# Patient Record
Sex: Male | Born: 1935 | Race: White | Hispanic: No | Marital: Single | Smoking: Former smoker
Health system: Southern US, Community
[De-identification: ages and names within clinical notes are randomized; demographics above are authoritative.]

## PROBLEM LIST (undated history)

## (undated) DIAGNOSIS — C3492 Malignant neoplasm of unspecified part of left bronchus or lung: Secondary | ICD-10-CM

## (undated) DIAGNOSIS — T4145XA Adverse effect of unspecified anesthetic, initial encounter: Secondary | ICD-10-CM

## (undated) DIAGNOSIS — R112 Nausea with vomiting, unspecified: Secondary | ICD-10-CM

## (undated) DIAGNOSIS — R51 Headache: Secondary | ICD-10-CM

## (undated) DIAGNOSIS — T8859XA Other complications of anesthesia, initial encounter: Secondary | ICD-10-CM

## (undated) DIAGNOSIS — F419 Anxiety disorder, unspecified: Secondary | ICD-10-CM

## (undated) DIAGNOSIS — K759 Inflammatory liver disease, unspecified: Secondary | ICD-10-CM

## (undated) DIAGNOSIS — R569 Unspecified convulsions: Secondary | ICD-10-CM

## (undated) DIAGNOSIS — N200 Calculus of kidney: Secondary | ICD-10-CM

## (undated) DIAGNOSIS — J189 Pneumonia, unspecified organism: Secondary | ICD-10-CM

## (undated) DIAGNOSIS — Z9889 Other specified postprocedural states: Secondary | ICD-10-CM

## (undated) DIAGNOSIS — M1990 Unspecified osteoarthritis, unspecified site: Secondary | ICD-10-CM

## (undated) DIAGNOSIS — K219 Gastro-esophageal reflux disease without esophagitis: Secondary | ICD-10-CM

## (undated) HISTORY — PX: HERNIA REPAIR: SHX51

## (undated) HISTORY — PX: CHOLECYSTECTOMY: SHX55

## (undated) HISTORY — PX: COLONOSCOPY: SHX174

---

## 2004-03-12 ENCOUNTER — Other Ambulatory Visit: Admission: RE | Admit: 2004-03-12 | Discharge: 2004-03-12 | Payer: Self-pay

## 2007-01-30 ENCOUNTER — Ambulatory Visit: Payer: Self-pay | Admitting: Cardiology

## 2007-03-17 ENCOUNTER — Inpatient Hospital Stay (HOSPITAL_COMMUNITY): Admission: RE | Admit: 2007-03-17 | Discharge: 2007-03-19 | Payer: Self-pay | Admitting: Neurosurgery

## 2007-03-17 ENCOUNTER — Ambulatory Visit: Payer: Self-pay | Admitting: Internal Medicine

## 2007-03-17 ENCOUNTER — Encounter: Payer: Self-pay | Admitting: Cardiovascular Disease

## 2007-03-17 ENCOUNTER — Ambulatory Visit: Payer: Self-pay | Admitting: Cardiovascular Disease

## 2008-07-24 IMAGING — CR DG CERVICAL SPINE 2 OR 3 VIEWS
1 series · 1 of 1 positions shown · non-contrast
Comparison: None.

CLINICAL DATA: ACDF. 
CERVICAL SPINE ? 2 VIEW - 03/17/07:

[view not recorded]
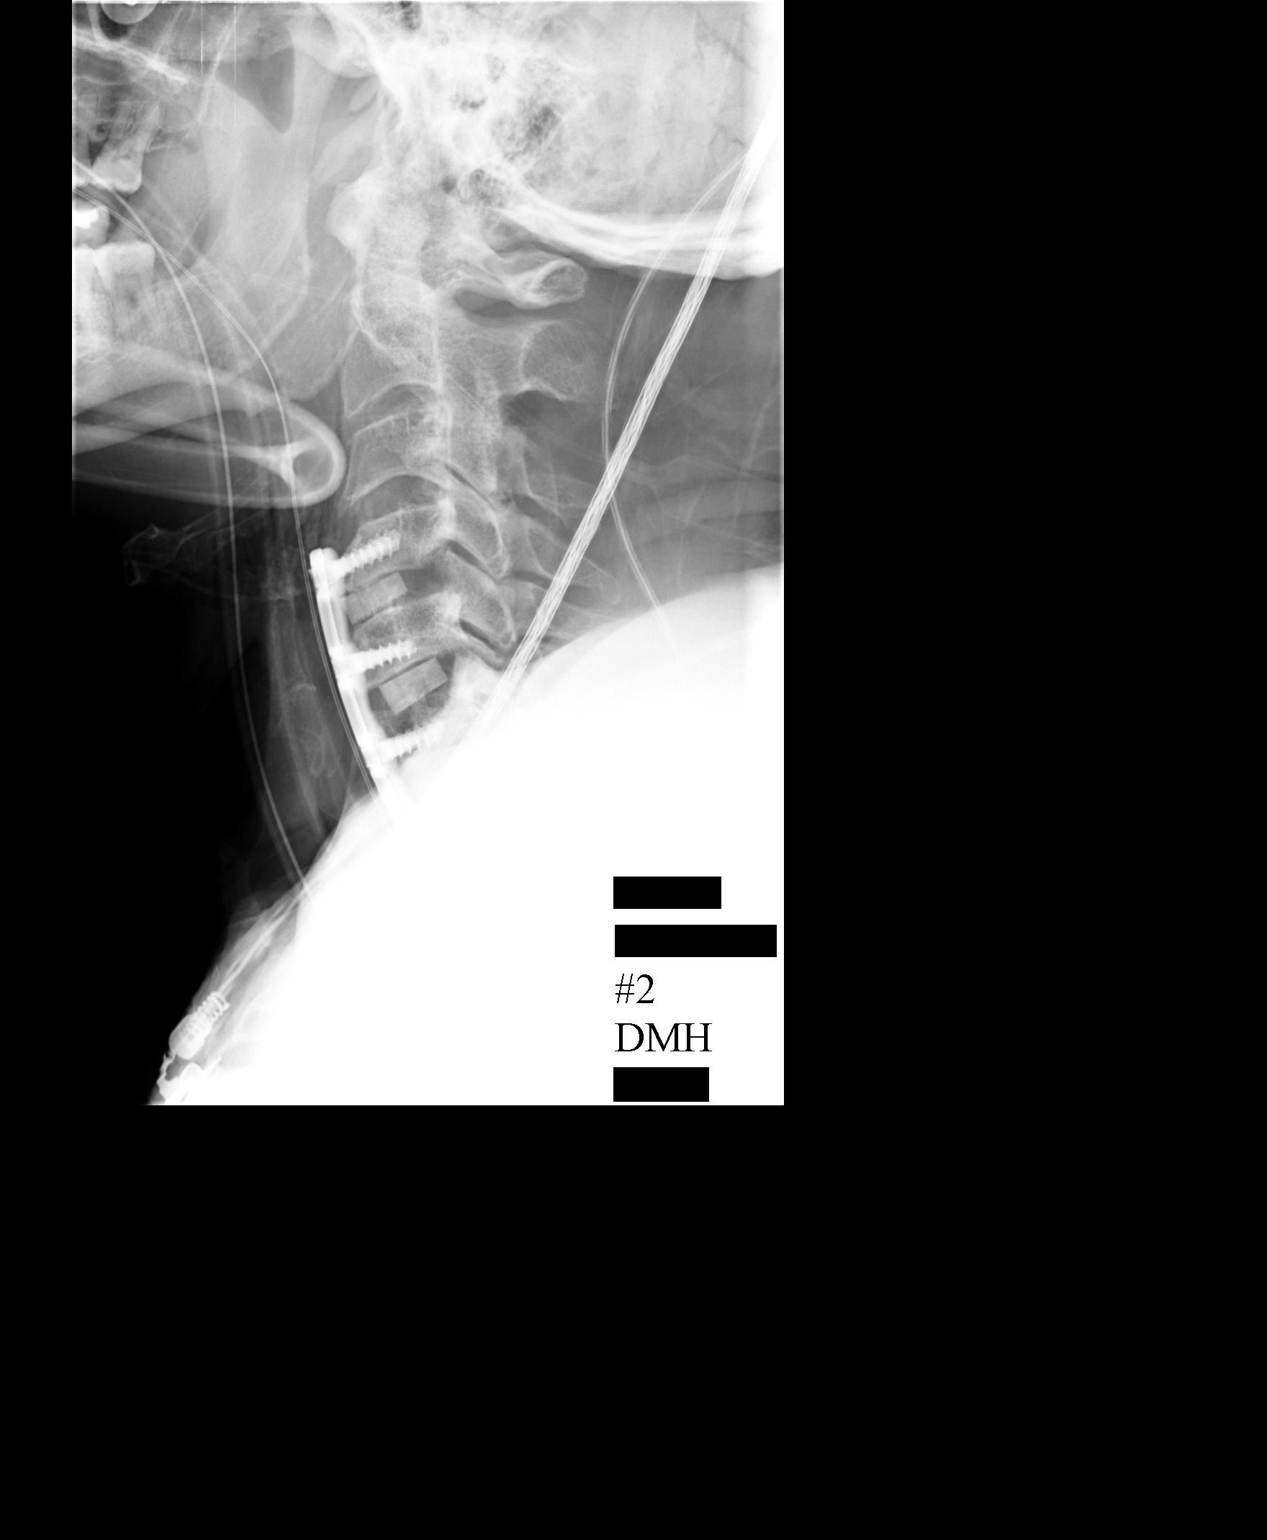

[1 of 1 positions shown; findings below may reference images not displayed]

FINDINGS: Two crosstable lateral views are submitted postoperatively from the operating room.  The initial view labeled #1 demonstrates an anterior localizing needle at C4-5.  
The 2nd view demonstrates interval anterior discectomy and fusion from C4 through C7 with anterior plate and screws and intervertebral bone plugs.  There is limited visualization inferior to the C6-7 disc space level.  No complications are evident.
IMPRESSION: Intraoperative views during C4-7 ACDF with limited inferior visualization.  No demonstrated complication.

## 2010-06-23 NOTE — Consult Note (Signed)
NAMEKELLER, Hunter Payne                 ACCOUNT NO.:  [id number]   MEDICAL RECORD NO.:  [id number]          PATIENT TYPE:  INP   LOCATION:  3009                           FACILITY:   PHYSICIAN:  Noralyn Pick. Eden Emms, MD, FACCDATE OF BIRTH:  February 07, 1935   DATE OF CONSULTATION:  03/17/2007  DATE OF DISCHARGE:                                 CONSULTATION   Hunter Payne is a 76 year old patient we are asked to see a relatively  acutely in the Neurology PACU.  He does not have a documented history of  coronary disease and has not been seen at [facility].  The patient  just underwent an anterior cervical diskectomy with fusion.  Postoperatively, he had chest pain.  Apparently intraoperatively, he had  some cardiac arrhythmias and Dr. Sondra Come felt that he had ST-segment  depression.   We reviewed EKGs for him.  In the perioperative period, he has a known  right bundle branch block which was at least described on stress testing  on January 30, 2007.  His EKGs primarily shows a right bundle branch  block with nonspecific ST-T wave changes.  He does have probably limb  lead reversal on his EKGs.  There is a question of an old MI since he  has minimal Q-waves in III and F.   His telemetry appears to show some periods of nonsustained ventricular  tachycardia.   The patient's pain is actually easing up with morphine   In the PACU, he had a bedside echocardiogram.  I have reviewed this and  he actually has hyperdynamic LV function with LVH, mild aortic root  dilatation with no regional wall motion abnormalities.  In talking to  the patient, however, he has had some exertional chest pain.  He has had  them over the last 3-5 years but states that over the last few weeks.  Because of this Dr. Cyndee Brightly had ordered a adenosine Myoview study.  We  received the report from January 30, 2007.  It was read by Dr. Myrtis Ser.  His Myoview study was normal with an EF in excess of 60%.   The patient currently has had stable  hemodynamics.  He has been treated  with analgesics, nitrates, and we will start esmolol drip.   He was also given a bolus of 2.5 mg of IV Lopressor.   The patient's past medical history is remarkable for cervical stenosis.  He has a history of a hernia repair in 1985, history of EtOH use without  hypertension, hypercholesterolemia or diabetes.   He is allergic to DEMEROL and NOVOCAIN.   His pain medicine here in the hospital includes Dilaudid, morphine, and  he got Ancef preoperatively.   FAMILY HISTORY:  Is remarkable for heart failure in the mother's side  but both mother and father died in their late 92s to 60s.  The patient  is married.  He lives at home.  His 60 year old son was a victim of a  local beating and has a brain injury.  He is a retired Product manager.   PHYSICAL EXAMINATION:  GENERAL:  The patient's  exam is remarkable for an  elderly white male in minor distress.  He has a cervical collar on with  a drain. VITAL SIGNS:  Currently stable.  The blood pressure 110/70,  heart rate is somewhat elevated between 90-100.  He is currently not  having any VT.  LUNGS:  Were clear with good diaphragmatic motion.  No wheezing.  HEART:  S1-S2 with normal heart sounds.  PMI normal.  ABDOMEN:  Benign bowel sounds are positive.  No abdominal aortic  aneurysm.  No hepatosplenomegaly.  Hepatic reflux.  EXTREMITIES:  Distal pulse are intact.  No edema.  NEURO:  Nonfocal.  SKIN:  Warm and dry.  No muscular weakness.   EKGs were as reviewed.  Initial point of care markers were negative.  His hematocrit is 39.5.  Initial CPK is 139 and negative.  Troponin is  0.01.  Potassium is slightly decreased at 3.6.   IMPRESSION:  1. Chest pain perioperatively.  Recent to Myoview which was low-risk.      No regional wall motion abnormalities by echo and no acute EKG      changes despite his nonsustained VT and question of ST-segment      changes on telemetry intraoperatively.  I do not  think he is having      an acute MI.  He will be treated with analgesics, nitro, and      esmolol which we will start at 500 mcg/kg bolus and 50 mcg/kg per      minute drip and titrate this to heart rate in the 70-80s.  The      patient is not a candidate for acute cath.  He still has a cervical      drain in and cannot take anticoagulants  2. Antecedent chest pain with nitro use.  The patient certainly needs      a workup.  If he rules out for myocardial infarction and remains      stable, suspect that catheterization will be in order.  He has had      a nonischemic Myoview recently but clearly has chest pain with      exertion and has taken nitro in the past and has now had some      nonsustained VT in the perioperative period.  3. Status post cervical spine surgery.  Routine postop care per      neurology.  We will be happy to follow the patient along here in      the hospital and make further recommendations before discharge.      Noralyn Pick. Eden Emms, MD, [facility]  Electronically Signed     PCN/MEDQ  D:  03/17/2007  T:  03/19/2007  Job:  [id number]

## 2010-06-23 NOTE — Op Note (Signed)
NAMEEARSEL, Hunter Payne                 ACCOUNT NO.:  [id number]   MEDICAL RECORD NO.:  [id number]          PATIENT TYPE:  INP   LOCATION:  2899                         FACILITY:  [facility]   PHYSICIAN:  Hilda Lias, M.D.   DATE OF BIRTH:  [birth year]   DATE OF PROCEDURE:  03/17/2007  DATE OF DISCHARGE:                               OPERATIVE REPORT   PREOPERATIVE DIAGNOSIS:  Cervical stenosis with cervical myelopathy C4,  C5-6, C6-7.   POSTOPERATIVE DIAGNOSIS:  Cervical stenosis with cervical myelopathy C4,  C5-6, C6-7.   PROCEDURE:  Anterior C4-5, C5-6, C6-7 diskectomy, decompression of the  spinal cord, foraminotomy, interbody fusion with allograft, plate,  microscope.   SURGEON:  Hilda Lias, M.D.   ASSISTANT:  Danae Orleans. Venetia Maxon, M.D.   CLINICAL HISTORY:  [name] is a gentleman seen by me in my office  because of difficulty walking.  He has had a problem with balance  associated with weakness.  This problem has been going on for several  years and in the past he was offered surgery but he declined.  X-rays  show severe spondylosis with stenosis and changes to the spinal cord  from C4 down to C7.  Surgery was advised.   DESCRIPTION OF PROCEDURE:  The patient was taken to the operating room  and after intubation, the left side of the neck was cleaned with  DuraPrep.  A longitudinal incision was made through the skin,  subcutaneous tissue and platysma down to cervical spine.  X-rays showed  that we were at the level of C4-5.  From then on a large osteophyte at  the level of C4-5 was removed.  We had to drill our way into the  anterior ligament, which was calcified.  Using the drill as well as the  1 and 2-mm Kerrison punch, we were able to remove quite a bit of  degenerative disk.  In the posterior ligament we found there was  calcification of the ligament with attachment to the dura mater.  Using  the 1-mm Kerrison punch, we were able to work our way above the dura  mater and  decompression of the spinal cord with bilateral foraminotomy  was done.  At the level of C5-6 and C6-7 we had the same finding but  slightly less pronounced but nevertheless quite compromising it all.  Diskectomy at C5-6 and C6-7 as well as foraminotomy was achieved.  Then  the endplates were drilled.  Three allografts, two of 7 mm, one of 6,  with autograft inside were inserted.  The one that was 6 was at the  level of C4-5 and the other one at the level of C6-7.  This was followed  by a plate using eight screws.  The lateral cervical spine showed good  position of the bone graft.  Then, although we achieved good hemostasis,  a drain was left in the precervical area.  From then on the wound was  closed with Vicryl and a Steri-Strip.           ______________________________  Hilda Lias, M.D.     EB/MEDQ  D:  03/17/2007  T:  03/18/2007  Job:  [id number]

## 2010-10-30 LAB — CBC
HCT: 39.5
HCT: 49.2
Hemoglobin: 13.7
Hemoglobin: 16.5
MCHC: 33.5
MCHC: 34.6
MCV: 91.5
Platelets: 205
RBC: 4.39
RDW: 12.6
RDW: 13.1

## 2010-10-30 LAB — COMPREHENSIVE METABOLIC PANEL
ALT: 19
ALT: 19
AST: 21
Albumin: 4.2
Alkaline Phosphatase: 51
BUN: 15
CO2: 24
CO2: 30
Chloride: 103
GFR calc non Af Amer: >60
GFR calc non Af Amer: >60
Glucose, Bld: 139 — ABNORMAL HIGH
Potassium: 3.6
Potassium: 4.4
Sodium: 140
Total Bilirubin: 1.1
Total Protein: 6

## 2010-10-30 LAB — CARDIAC PANEL(CRET KIN+CKTOT+MB+TROPI)
CK, MB: 2.8
Relative Index: 1.1
Relative Index: 1.3
Relative Index: 1.6
Troponin I: <0.01

## 2010-10-30 LAB — MAGNESIUM: Magnesium: 1.8

## 2013-02-21 NOTE — H&P (Signed)
HISTORY AND PHYSICAL  Hunter Payne is a 79 y.o. male patient with OV:FIEPPIRCC:Painful teeth  No diagnosis found.  No past medical history on file.  No current facility-administered medications for this encounter.   No current outpatient prescriptions on file.   Allergies not on file Active Problems:   * No active hospital problems. *  Vitals: There were no vitals taken for this visit. Lab results:No results found for this or any previous visit (from the past 24 hour(s)). Radiology Results: No results found. General appearance: alert, cooperative and no distress Head: Normocephalic, without obvious abnormality, atraumatic Eyes: negative Nose: Nares normal. Septum midline. Mucosa normal. No drainage or sinus tenderness. Throat: Dental caries teeth  #3, 4, 5, 9, 18, 23, 24, 25, 26, 28, 31, bilateral mandibular tori Neck: no adenopathy, supple, symmetrical, trachea midline and thyroid not enlarged, symmetric, no tenderness/mass/nodules Resp: clear to auscultation bilaterally Cardio: regular rate and rhythm, S1, S2 normal, no murmur, click, rub or gallop  Assessment:Non-restorable teeth # 3, 4, 5, 9, 18, 23, 24, 25, 26, 28, 31, bilateral mandibular tori  Plan:Extrction teeth #3, 4, 5, 9, 18, 23, 24, 25, 26, 28, 31, removal  bilateral mandibular tori, alveoloplasty. General anesthesia. Day surgery.   Georgia LopesJENSEN,Ellana Kawa M 02/21/2013

## 2013-02-23 ENCOUNTER — Encounter (HOSPITAL_COMMUNITY)
Admission: RE | Admit: 2013-02-23 | Discharge: 2013-02-23 | Disposition: A | Discharge status: Home / Self Care | Payer: Medicare Other | Source: Ambulatory Visit | Attending: Oral Surgery | Admitting: Oral Surgery

## 2013-02-23 ENCOUNTER — Encounter (HOSPITAL_COMMUNITY): Payer: Self-pay

## 2013-02-23 DIAGNOSIS — Z01812 Encounter for preprocedural laboratory examination: Secondary | ICD-10-CM | POA: Diagnosis not present

## 2013-02-23 DIAGNOSIS — M278 Other specified diseases of jaws: Secondary | ICD-10-CM | POA: Diagnosis not present

## 2013-02-23 DIAGNOSIS — K083 Retained dental root: Secondary | ICD-10-CM | POA: Diagnosis not present

## 2013-02-23 DIAGNOSIS — M898X9 Other specified disorders of bone, unspecified site: Secondary | ICD-10-CM | POA: Diagnosis present

## 2013-02-23 DIAGNOSIS — K219 Gastro-esophageal reflux disease without esophagitis: Secondary | ICD-10-CM | POA: Diagnosis not present

## 2013-02-23 DIAGNOSIS — K029 Dental caries, unspecified: Secondary | ICD-10-CM | POA: Diagnosis not present

## 2013-02-23 DIAGNOSIS — M129 Arthropathy, unspecified: Secondary | ICD-10-CM | POA: Diagnosis not present

## 2013-02-23 DIAGNOSIS — Z87891 Personal history of nicotine dependence: Secondary | ICD-10-CM | POA: Diagnosis not present

## 2013-02-23 HISTORY — DX: Nausea with vomiting, unspecified: R11.2

## 2013-02-23 HISTORY — DX: Gastro-esophageal reflux disease without esophagitis: K21.9

## 2013-02-23 HISTORY — DX: Calculus of kidney: N20.0

## 2013-02-23 HISTORY — DX: Anxiety disorder, unspecified: F41.9

## 2013-02-23 HISTORY — DX: Inflammatory liver disease, unspecified: K75.9

## 2013-02-23 HISTORY — DX: Pneumonia, unspecified organism: J18.9

## 2013-02-23 HISTORY — DX: Adverse effect of unspecified anesthetic, initial encounter: T41.45XA

## 2013-02-23 HISTORY — DX: Other complications of anesthesia, initial encounter: T88.59XA

## 2013-02-23 HISTORY — DX: Other specified postprocedural states: Z98.890

## 2013-02-23 HISTORY — DX: Unspecified convulsions: R56.9

## 2013-02-23 HISTORY — DX: Headache: R51

## 2013-02-23 HISTORY — DX: Unspecified osteoarthritis, unspecified site: M19.90

## 2013-02-23 LAB — COMPREHENSIVE METABOLIC PANEL
ALT: 9 U/L (ref 0–53)
AST: 12 U/L (ref 0–37)
Albumin: 3.6 g/dL (ref 3.5–5.2)
Alkaline Phosphatase: 69 U/L (ref 39–117)
BUN: 11 mg/dL (ref 6–23)
CALCIUM: 9.1 mg/dL (ref 8.4–10.5)
CO2: 24 mEq/L (ref 19–32)
Chloride: 101 mEq/L (ref 96–112)
Creatinine, Ser: 0.73 mg/dL (ref 0.50–1.35)
GFR calc non Af Amer: 88 mL/min — ABNORMAL LOW (ref >90)
GLUCOSE: 97 mg/dL (ref 70–99)
Potassium: 4.3 mEq/L (ref 3.7–5.3)
Sodium: 139 mEq/L (ref 137–147)
TOTAL PROTEIN: 7.2 g/dL (ref 6.0–8.3)
Total Bilirubin: 0.3 mg/dL (ref 0.3–1.2)

## 2013-02-23 LAB — CBC
HEMATOCRIT: 43.5 % (ref 39.0–52.0)
HEMOGLOBIN: 15.4 g/dL (ref 13.0–17.0)
MCH: 31.2 pg (ref 26.0–34.0)
MCHC: 35.4 g/dL (ref 30.0–36.0)
MCV: 88.1 fL (ref 78.0–100.0)
Platelets: 192 10*3/uL (ref 150–400)
RBC: 4.94 MIL/uL (ref 4.22–5.81)
RDW: 12.7 % (ref 11.5–15.5)
WBC: 7.6 10*3/uL (ref 4.0–10.5)

## 2013-02-23 NOTE — Pre-Procedure Instructions (Signed)
Landry DykeBilly R Borchard  02/23/2013   Your procedure is scheduled on:  Monday, February 26, 2013 @ 10:30 AM  [facility]use Main Entrance "A'') at 8:30 AM.  Call this number if you have problems the morning of surgery: [phone]   Remember:   Do not eat food or drink liquids after midnight.   Take these medicines the morning of surgery with A SIP OF WATER: omeprazole (PRILOSEC) 20 MG capsule if needed: oxyCODONE-acetaminophen (PERCOCET) 10-325 MG per tablet Stop taking Aspirin, vitamins and herbal medications. Do not take any NSAIDs ie: Ibuprofen, Advil, Naproxen or any medication containing Aspirin.  Do not wear jewelry, make-up or nail polish.  Do not wear lotions, powders, or perfumes. You may wear deodorant.  Do not shave 48 hours prior to surgery. Men may shave face and neck.  Do not bring valuables to the hospital.  [facility] not [facility] for any belongings or valuables.               Contacts, dentures or bridgework may not be worn into surgery.  Leave suitcase in the car. After surgery it may be brought to your room.  For patients admitted to the hospital, discharge time is determined by your  treatment team.               Patients discharged the day of surgery will not be allowed to drive home.  Name and phone number of your driver:   Special Instructions: Shower using CHG 2 nights before surgery and the night before surgery.  If you shower the day of surgery use CHG.  Use special wash - you have one bottle of CHG for all showers.  You should use approximately 1/3 of the bottle for each shower.   Please read over the following fact sheets that you were given: Pain Booklet, Coughing and Deep Breathing and Surgical Site Infection Prevention

## 2013-02-25 MED ORDER — CEFAZOLIN SODIUM-DEXTROSE 2-3 GM-% IV SOLR
2.0000 g | INTRAVENOUS | Status: AC
  Administered 2013-02-26: 2 g via INTRAVENOUS

## 2013-02-26 ENCOUNTER — Encounter (HOSPITAL_COMMUNITY)
Admission: RE | Disposition: A | Discharge status: Home / Self Care | Payer: Self-pay | Source: Ambulatory Visit | Attending: Oral Surgery

## 2013-02-26 ENCOUNTER — Ambulatory Visit (HOSPITAL_COMMUNITY): Payer: Medicare Other | Admitting: Anesthesiology

## 2013-02-26 ENCOUNTER — Encounter (HOSPITAL_COMMUNITY): Payer: Self-pay | Admitting: *Deleted

## 2013-02-26 ENCOUNTER — Encounter (HOSPITAL_COMMUNITY): Payer: Medicare Other | Admitting: Anesthesiology

## 2013-02-26 ENCOUNTER — Ambulatory Visit (HOSPITAL_COMMUNITY)
Admission: RE | Admit: 2013-02-26 | Discharge: 2013-02-26 | Disposition: A | Discharge status: Home / Self Care | Payer: Medicare Other | Source: Ambulatory Visit | Attending: Oral Surgery | Admitting: Oral Surgery

## 2013-02-26 DIAGNOSIS — M270 Developmental disorders of jaws: Secondary | ICD-10-CM

## 2013-02-26 DIAGNOSIS — K0530 Chronic periodontitis, unspecified: Secondary | ICD-10-CM

## 2013-02-26 DIAGNOSIS — K029 Dental caries, unspecified: Secondary | ICD-10-CM | POA: Insufficient documentation

## 2013-02-26 DIAGNOSIS — K219 Gastro-esophageal reflux disease without esophagitis: Secondary | ICD-10-CM | POA: Insufficient documentation

## 2013-02-26 DIAGNOSIS — M278 Other specified diseases of jaws: Secondary | ICD-10-CM | POA: Diagnosis not present

## 2013-02-26 DIAGNOSIS — Z01812 Encounter for preprocedural laboratory examination: Secondary | ICD-10-CM | POA: Insufficient documentation

## 2013-02-26 DIAGNOSIS — Z87891 Personal history of nicotine dependence: Secondary | ICD-10-CM | POA: Insufficient documentation

## 2013-02-26 DIAGNOSIS — M129 Arthropathy, unspecified: Secondary | ICD-10-CM | POA: Insufficient documentation

## 2013-02-26 DIAGNOSIS — K083 Retained dental root: Secondary | ICD-10-CM | POA: Insufficient documentation

## 2013-02-26 HISTORY — PX: MULTIPLE EXTRACTIONS WITH ALVEOLOPLASTY: SHX5342

## 2013-02-26 SURGERY — MULTIPLE EXTRACTION WITH ALVEOLOPLASTY
Anesthesia: General | Site: Mouth

## 2013-02-26 MED ORDER — BUPIVACAINE-EPINEPHRINE 0.25% -1:200000 IJ SOLN
INTRAMUSCULAR | Status: DC | PRN
  Administered 2013-02-26: 15 mL

## 2013-02-26 MED ORDER — LACTATED RINGERS IV SOLN
Freq: Once | INTRAVENOUS | Status: AC
  Administered 2013-02-26: 08:00:00 via INTRAVENOUS

## 2013-02-26 MED ORDER — FENTANYL CITRATE 0.05 MG/ML IJ SOLN
INTRAMUSCULAR | Status: DC | PRN
  Administered 2013-02-26: 75 ug via INTRAVENOUS

## 2013-02-26 MED ORDER — LACTATED RINGERS IV SOLN
INTRAVENOUS | Status: DC | PRN
  Administered 2013-02-26: 08:00:00 via INTRAVENOUS

## 2013-02-26 MED ORDER — HYDROMORPHONE HCL PF 1 MG/ML IJ SOLN
0.2500 mg | INTRAMUSCULAR | Status: DC | PRN
Start: 2013-02-26 — End: 2013-02-26

## 2013-02-26 MED ORDER — OXYCODONE-ACETAMINOPHEN 10-325 MG PO TABS: 1.0000 | ORAL_TABLET | ORAL | Status: AC | PRN

## 2013-02-26 MED ORDER — PROPOFOL 10 MG/ML IV BOLUS
INTRAVENOUS | Status: DC | PRN
  Administered 2013-02-26: 200 mg via INTRAVENOUS

## 2013-02-26 MED ORDER — OXYCODONE HCL 5 MG/5ML PO SOLN
5.0000 mg | Freq: Once | ORAL | Status: AC | PRN
  Administered 2013-02-26: 5 mg via ORAL

## 2013-02-26 MED ORDER — EPHEDRINE SULFATE 50 MG/ML IJ SOLN
INTRAMUSCULAR | Status: DC | PRN
  Administered 2013-02-26: 10 mg via INTRAVENOUS
  Administered 2013-02-26 (×2): 5 mg via INTRAVENOUS

## 2013-02-26 MED ORDER — ACETAMINOPHEN 160 MG/5ML PO SUSP
ORAL | Status: AC
  Filled 2013-02-26: qty 10

## 2013-02-26 MED ORDER — OXYCODONE HCL 5 MG/5ML PO SOLN
ORAL | Status: AC
  Filled 2013-02-26: qty 15

## 2013-02-26 MED ORDER — ONDANSETRON HCL 4 MG/2ML IJ SOLN
INTRAMUSCULAR | Status: DC | PRN
  Administered 2013-02-26: 4 mg via INTRAVENOUS

## 2013-02-26 MED ORDER — SODIUM CHLORIDE 0.9 % IR SOLN
Status: DC | PRN
  Administered 2013-02-26: 1000 mL

## 2013-02-26 MED ORDER — PHENYLEPHRINE HCL 10 MG/ML IJ SOLN
INTRAMUSCULAR | Status: DC | PRN
  Administered 2013-02-26 (×4): 80 ug via INTRAVENOUS

## 2013-02-26 MED ORDER — SUCCINYLCHOLINE CHLORIDE 20 MG/ML IJ SOLN
INTRAMUSCULAR | Status: DC | PRN
  Administered 2013-02-26: 100 mg via INTRAVENOUS

## 2013-02-26 MED ORDER — BUPIVACAINE-EPINEPHRINE PF 0.25-1:200000 % IJ SOLN
INTRAMUSCULAR | Status: AC
  Filled 2013-02-26: qty 30

## 2013-02-26 MED ORDER — OXYCODONE HCL 5 MG PO TABS: 5.0000 mg | ORAL_TABLET | Freq: Once | ORAL | Status: AC | PRN

## 2013-02-26 MED ORDER — ACETAMINOPHEN 160 MG/5ML PO SOLN
320.0000 mg | Freq: Once | ORAL | Status: AC
  Administered 2013-02-26: 320 mg via ORAL

## 2013-02-26 MED ORDER — 0.9 % SODIUM CHLORIDE (POUR BTL) OPTIME
TOPICAL | Status: DC | PRN
  Administered 2013-02-26: 1000 mL

## 2013-02-26 MED ORDER — DEXAMETHASONE SODIUM PHOSPHATE 4 MG/ML IJ SOLN
INTRAMUSCULAR | Status: DC | PRN
  Administered 2013-02-26: 8 mg via INTRAVENOUS

## 2013-02-26 MED ORDER — ONDANSETRON HCL 4 MG/2ML IJ SOLN: 4.0000 mg | Freq: Four times a day (QID) | INTRAMUSCULAR | Status: DC | PRN

## 2013-02-26 MED ORDER — OXYCODONE HCL 5 MG PO TABS
10.0000 mg | ORAL_TABLET | Freq: Once | ORAL | Status: AC
  Administered 2013-02-26: 10 mg via ORAL

## 2013-02-26 MED ORDER — OXYMETAZOLINE HCL 0.05 % NA SOLN
NASAL | Status: DC | PRN
  Administered 2013-02-26: 1 via NASAL

## 2013-02-26 SURGICAL SUPPLY — 28 items
BUR CROSS CUT FISSURE 1.6 (BURR) ×2 IMPLANT
BUR CROSS CUT FISSURE 1.6MM (BURR) ×1
BUR EGG ELITE 4.0 (BURR) ×1 IMPLANT
BUR EGG ELITE 4.0MM (BURR) ×1
CANISTER SUCTION 2500CC (MISCELLANEOUS) ×3 IMPLANT
COVER SURGICAL LIGHT HANDLE (MISCELLANEOUS) ×3 IMPLANT
CRADLE DONUT ADULT HEAD (MISCELLANEOUS) ×3 IMPLANT
DECANTER SPIKE VIAL GLASS SM (MISCELLANEOUS) ×3 IMPLANT
GAUZE PACKING FOLDED 2  STR (GAUZE/BANDAGES/DRESSINGS) ×2
GAUZE PACKING FOLDED 2 STR (GAUZE/BANDAGES/DRESSINGS) ×1 IMPLANT
GLOVE BIO SURGEON STRL SZ 6.5 (GLOVE) ×4 IMPLANT
GLOVE BIO SURGEON STRL SZ7.5 (GLOVE) ×3 IMPLANT
GLOVE BIO SURGEONS STRL SZ 6.5 (GLOVE) ×2
GLOVE BIOGEL PI IND STRL 7.0 (GLOVE) ×2 IMPLANT
GLOVE BIOGEL PI INDICATOR 7.0 (GLOVE) ×4
GOWN STRL NON-REIN LRG LVL3 (GOWN DISPOSABLE) ×6 IMPLANT
GOWN STRL REIN XL XLG (GOWN DISPOSABLE) ×3 IMPLANT
KIT BASIN OR (CUSTOM PROCEDURE TRAY) ×3 IMPLANT
KIT ROOM TURNOVER OR (KITS) ×3 IMPLANT
NEEDLE 22X1 1/2 (OR ONLY) (NEEDLE) ×3 IMPLANT
NS IRRIG 1000ML POUR BTL (IV SOLUTION) ×3 IMPLANT
PAD ARMBOARD 7.5X6 YLW CONV (MISCELLANEOUS) ×6 IMPLANT
SUT CHROMIC 3 0 PS 2 (SUTURE) ×5 IMPLANT
SYR CONTROL 10ML LL (SYRINGE) ×3 IMPLANT
TOWEL OR 17X26 10 PK STRL BLUE (TOWEL DISPOSABLE) ×3 IMPLANT
TRAY ENT MC OR (CUSTOM PROCEDURE TRAY) ×3 IMPLANT
TUBING IRRIGATION (MISCELLANEOUS) ×2 IMPLANT
YANKAUER SUCT BULB TIP NO VENT (SUCTIONS) ×3 IMPLANT

## 2013-02-26 NOTE — H&P (Signed)
H&P documentation  -History and Physical Reviewed  -Patient has been re-examined  -No change in the plan of care  Hunter Payne M  

## 2013-02-26 NOTE — Anesthesia Preprocedure Evaluation (Signed)
Anesthesia Evaluation  Patient identified by MRN, date of birth, ID band Patient awake    Reviewed: Allergy & Precautions, H&P , NPO status , Patient's Chart, lab work & pertinent test results  History of Anesthesia Complications (+) PONV  Airway Mallampati: II  Neck ROM: full    Dental   Pulmonary former smoker,          Cardiovascular     Neuro/Psych  Headaches, Seizures -,  Anxiety    GI/Hepatic GERD-  ,(+) Hepatitis -  Endo/Other    Renal/GU      Musculoskeletal  (+) Arthritis -,   Abdominal   Peds  Hematology   Anesthesia Other Findings   Reproductive/Obstetrics                           Anesthesia Physical Anesthesia Plan  ASA: II  Anesthesia Plan: General   Post-op Pain Management:    Induction: Intravenous  Airway Management Planned: Nasal ETT  Additional Equipment:   Intra-op Plan:   Post-operative Plan: Extubation in OR  Informed Consent: I have reviewed the patients History and Physical, chart, labs and discussed the procedure including the risks, benefits and alternatives for the proposed anesthesia with the patient or authorized representative who has indicated his/her understanding and acceptance.     Plan Discussed with: CRNA, Anesthesiologist and Surgeon  Anesthesia Plan Comments:         Anesthesia Quick Evaluation

## 2013-02-26 NOTE — Anesthesia Procedure Notes (Signed)
Procedure Name: Intubation Date/Time: 02/26/2013 10:22 AM Performed by: Sharlene DoryWALKER, Jairo Bellew E Pre-anesthesia Checklist: Patient identified, Emergency Drugs available, Suction available, Patient being monitored and Timeout performed Patient Re-evaluated:Patient Re-evaluated prior to inductionOxygen Delivery Method: Circle system utilized Preoxygenation: Pre-oxygenation with 100% oxygen Intubation Type: IV induction Laryngoscope Size: Mac and 4 Grade View: Grade II Nasal Tubes: Left, Nasal prep performed, Nasal Rae and Magill forceps- large, utilized Tube size: 7.0 mm Number of attempts: 1 Placement Confirmation: ETT inserted through vocal cords under direct vision,  positive ETCO2 and breath sounds checked- equal and bilateral Tube secured with: Tape Dental Injury: Teeth and Oropharynx as per pre-operative assessment and Bloody posterior oropharynx

## 2013-02-26 NOTE — Discharge Instructions (Signed)
What to Eat after Tooth extraction:   ° ° °For your first meals, you should eat lightly; only small meals at first.   Avoid Sharp, Crunchy, and Hot foods.   If you do not have nausea, you may eat larger meals.  Avoid spicy, greasy and heavy food, as these may make you sick after the anesthesia.  ° ° °General Anesthesia, Adult, Care After  °Refer to this sheet in the next few weeks. These instructions provide you with information on caring for yourself after your procedure. Your health care provider may also give you more specific instructions. Your treatment has been planned according to current medical practices, but problems sometimes occur. Call your health care provider if you have any problems or questions after your procedure.  °WHAT TO EXPECT AFTER THE PROCEDURE  °After the procedure, it is typical to experience:  °Sleepiness.  °Nausea and vomiting. °HOME CARE INSTRUCTIONS  °For the first 24 hours after general anesthesia:  °Have a responsible person with you.  °Do not drive a car. If you are alone, do not take public transportation.  °Do not drink alcohol.  °Do not take medicine that has not been prescribed by your health care provider.  °Do not sign important papers or make important decisions.  °You may resume a normal diet and activities as directed by your health care provider.  °Change bandages (dressings) as directed.  °If you have questions or problems that seem related to general anesthesia, call the hospital and ask for the anesthetist or anesthesiologist on call. °SEEK MEDICAL CARE IF:  °You have nausea and vomiting that continue the day after anesthesia.  °You develop a rash. °SEEK IMMEDIATE MEDICAL CARE IF:  °You have difficulty breathing.  °You have chest pain.  °You have any allergic problems. °Document Released: 05/03/2000 Document Revised: 09/27/2012 Document Reviewed: 08/10/2012  °ExitCare® Patient Information ©2014 [employer].  ° ° °

## 2013-02-26 NOTE — Transfer of Care (Signed)
Immediate Anesthesia Transfer of Care Note  Patient: Hunter Payne  Procedure(s) Performed: Procedure(s): MULTIPLE EXTRACION WITH ALVEOLOPLASTY/REMOVAL TORI (N/A)  Patient Location: PACU  Anesthesia Type:General  Level of Consciousness: awake, alert  and oriented  Airway & Oxygen Therapy: Patient Spontanous Breathing and Patient connected to face mask oxygen  Post-op Assessment: Report given to PACU RN, Post -op Vital signs reviewed and stable and Patient moving all extremities X 4  Post vital signs: Reviewed and stable  Complications: No apparent anesthesia complications

## 2013-02-26 NOTE — Anesthesia Postprocedure Evaluation (Signed)
Anesthesia Post Note  Patient: Hunter Payne  Procedure(s) Performed: Procedure(s) (LRB): MULTIPLE EXTRACION WITH ALVEOLOPLASTY/REMOVAL TORI (N/A)  Anesthesia type: General  Patient location: PACU  Post pain: Pain level controlled and Adequate analgesia  Post assessment: Post-op Vital signs reviewed, Patient's Cardiovascular Status Stable, Respiratory Function Stable, Patent Airway and Pain level controlled  Last Vitals:  Filed Vitals:   02/26/13 1200  BP: 148/85  Pulse: 87  Temp: 36.3 C  Resp:     Post vital signs: Reviewed and stable  Level of consciousness: awake, alert  and oriented  Complications: No apparent anesthesia complications

## 2013-02-26 NOTE — Op Note (Signed)
02/26/2013  11:09 AM  PATIENT:  Hunter Payne  79 y.o. male  PRE-OPERATIVE DIAGNOSIS:  NONRESTORABLE TEETH # 3, 4, 5, 6, 9, 18, 23, 24, 25, 26, 28, 30, BILATERAL MANDIBULAR LINGUAL TORI  POST-OPERATIVE DIAGNOSIS:  SAME + Retained root # 21, Left maxillary buccal exostosis  PROCEDURE:  Procedure(s): MULTIPLE EXTRACTION 3, 4, 5, 6, 18, 21, 23, 24, 25, 26, 28, 30 WITH ALVEOLOPLASTY,  REMOVAL TORI,  removal left maxillary buccal exostosis, Closure Right oral antral communication with connective tissue graft  SURGEON:  Surgeon(s): Georgia LopesScott M Kiasha Bellin, DDS  ANESTHESIA:   local and general  EBL:  minimal  DRAINS: none   SPECIMEN:  No Specimen  COUNTS:  YES  PLAN OF CARE: Discharge to home after PACU  PATIENT DISPOSITION:  PACU - hemodynamically stable.   PROCEDURE DETAILS: Dictation #[id number]  Georgia LopesScott M. Jalaina Salyers, DMD 02/26/2013 11:09 AM

## 2013-02-26 NOTE — Preoperative (Signed)
Beta Blockers   Reason not to administer Beta Blockers:Not Applicable 

## 2013-02-26 NOTE — Op Note (Signed)
NAMJosetta Huddle:  Baiz, Jaylun                 ACCOUNT NO.:  [id number]  MEDICAL RECORD NO.:  [id number]  LOCATION:  [facility]                         FACILITY:  [facility]  PHYSICIAN:  Georgia LopesScott M. Aveyah Greenwood, M.D.  DATE OF BIRTH:  1934/04/20  DATE OF PROCEDURE:  02/26/2013 DATE OF DISCHARGE:  02/26/2013                              OPERATIVE REPORT   PREOPERATIVE DIAGNOSES:  Nonrestorable teeth #3, 4, 5, 6, 9, 18, 23, 24, 25, 26, 28, 30, bilateral mandibular lingual tori.  POSTOPERATIVE DIAGNOSES:  Nonrestorable teeth #3, 4, 5, 6, 9, 18, 23, 24, 25, 26, 28, 30, bilateral mandibular lingual tori, retained root #21, previously extracted tooth #9 and left maxillary buccal exostosis.  PROCEDURE:  Multiple extraction of teeth #3, 4, 5, 6, 18, 21, 23, 24, 25, 26, 28, 30, alveoplasty of right and left maxilla and mandible, removal of bilateral mandibular lingual tori and removal of left maxillary buccal exostosis.  SURGEON:  Georgia LopesScott M. Malakye Nolden, MD  ANESTHESIA:  General nasal intubation.  PROCEDURE:  The patient was taken to the operating room, placed on the table in supine position.  General anesthesia was administered intravenously and a nasal endotracheal tube was placed and secured.  The eyes were protected and the patient was draped for the procedure.  A time-out was performed.  The posterior pharynx was suctioned and a throat pack was placed.  A 2% lidocaine with 1:100,000 epinephrine was infiltrated in an inferior alveolar block on the right and left sides and a buccal and palatal infiltration in the maxilla total of 15 mL was utilized, and a sweetheart retractor and bite block were placed inside the mouth and then a 15 blade was used to make an incision in the mandible around tooth #18 carrying lingually along the gingival sulcus until tooth #24 was reached.  The periosteum was reflected.  Tooth #18 was removed using the 301 elevator and lower universal forceps.  Then, the periosteum was reflected in the  area of #21.  There was found to be a root imbedded in the bone.  The handpiece was used to remove bone around this and then the root was elevated with a 301 elevator and then the anterior teeth #23, 24, 25, and 26 were elevated and with a 301 elevator and removed from the mouth with Asch forceps.  Then, the lingual periosteum was reflected away from the bony torus and a Seldin retractor was used to retract the lingual tissues for protection.  Then, the egg-shaped bur and bone file were used to perform the torus removal. Then, the area was irrigated and closed with 3-0 chromic.  A 15 blade was then used to make an incision beginning in the area of tooth #14, carrying anteriorly to tooth #9.  The periosteum was reflected.  No tooth #9 was identified or any root fragments.  There was, however, a left buccal exostosis in the area of tooth #12.  This was smoothed with the egg-shaped bur and bone file.  Then, this area was irrigated and closed with 3-0 chromic.  Then, the bite block and sweetheart retractor were repositioned to the other side of the mouth and a 15 blade was used to make  a lingual incision beginning at tooth #27 carrying approximately tooth #31.  The periosteum was reflected and attempts were made to remove teeth #29 and 30 with the forceps.  However, the teeth fractured and required bone removal and sectioning prior to removal.  Then, the teeth were elevated with a 301 elevator and removed from the mouth. Then, the lingual tissue was further retracted and using the egg-shaped bur and bone file, the alveoplasty was performed within the mandible and the right side was closed after irrigating with 3-0 chromic.  Then in the maxilla, a 15 blade was used to make an incision around teeth #3, 4, 5, 6 on the buccal and palatal aspects.  The periosteum was reflected. The teeth were elevated with 301 elevator.  Tooth #3 required sectioning and removal of individual roots.  Teeth #4, 5  were removed with the universal forceps.  Tooth #6 required additional bone removal as well using the Stryker handpiece.  After the right maxillary teeth were removed, there was approximately 5 mm opening into the maxillary sinus at the distal buccal and mesial buccal root area.  Incision was created over the right palatal fibrous tuberosity and a connective tissue underneath the tuberosity was removed with a 15 blade, and placed in the oral antral opening and sutured there with 3-0 chromic.  Then, prior to placing this soft tissue graft, the alveoplasty had been performed with the egg-shaped bur and bone file.  After the connective tissue graft was placed over the oral antral communication, then, the area was closed with 3-0 chromic.  The oral cavity was inspected and found to have good contour hemostasis and closure.  The oral cavity was irrigated, suctioned, and a throat pack was removed.  The patient was awakened, taken to the recovery room, breathing spontaneously in good condition.  ESTIMATED BLOOD LOSS:  Minimal.  COMPLICATIONS:  None.  SPECIMENS:  None.     Georgia Lopes, M.D.     SMJ/MEDQ  D:  02/26/2013  T:  02/26/2013  Job:  [id number]

## 2013-03-01 ENCOUNTER — Encounter (HOSPITAL_COMMUNITY): Payer: Self-pay | Admitting: Oral Surgery

## 2015-02-19 ENCOUNTER — Emergency Department: Admit: 2015-02-19 | Payer: MEDICARE | Primary: Family Medicine

## 2015-02-19 ENCOUNTER — Inpatient Hospital Stay
Admit: 2015-02-19 | Discharge: 2015-02-20 | Disposition: A | Discharge status: Home / Self Care | Payer: MEDICARE | Attending: Emergency Medicine

## 2015-02-19 DIAGNOSIS — C3432 Malignant neoplasm of lower lobe, left bronchus or lung: Secondary | ICD-10-CM

## 2015-02-19 LAB — URINALYSIS W/MICROSCOPIC
Bacteria: NEGATIVE /hpf
Bilirubin: NEGATIVE
Blood: NEGATIVE
Glucose: NEGATIVE mg/dL
Ketone: NEGATIVE mg/dL
Nitrites: NEGATIVE
Protein: NEGATIVE mg/dL
Specific gravity: 1.016 (ref 1.003–1.030)
Urobilinogen: 1 EU/dL (ref 0.2–1.0)
pH (UA): 6 (ref 5.0–8.0)

## 2015-02-19 LAB — METABOLIC PANEL, COMPREHENSIVE
A-G Ratio: 0.9 — ABNORMAL LOW (ref 1.1–2.2)
ALT (SGPT): 14 U/L (ref 12–78)
AST (SGOT): 18 U/L (ref 15–37)
Albumin: 3.2 g/dL — ABNORMAL LOW (ref 3.5–5.0)
Alk. phosphatase: 89 U/L (ref 45–117)
Anion gap: 6 mmol/L (ref 5–15)
BUN/Creatinine ratio: 17 (ref 12–20)
BUN: 13 MG/DL (ref 6–20)
Bilirubin, total: 0.5 MG/DL (ref 0.2–1.0)
CO2: 27 mmol/L (ref 21–32)
Calcium: 8.5 MG/DL (ref 8.5–10.1)
Chloride: 104 mmol/L (ref 97–108)
Creatinine: 0.75 MG/DL (ref 0.70–1.30)
GFR est AA: >60 mL/min/{1.73_m2} (ref >60)
GFR est non-AA: >60 mL/min/{1.73_m2} (ref >60)
Globulin: 3.6 g/dL (ref 2.0–4.0)
Glucose: 98 mg/dL (ref 65–100)
Potassium: 4.2 mmol/L (ref 3.5–5.1)
Protein, total: 6.8 g/dL (ref 6.4–8.2)
Sodium: 137 mmol/L (ref 136–145)

## 2015-02-19 LAB — CBC WITH AUTOMATED DIFF
ABS. BASOPHILS: 0 10*3/uL (ref 0.0–0.1)
ABS. EOSINOPHILS: 0.1 10*3/uL (ref 0.0–0.4)
ABS. LYMPHOCYTES: 0.6 10*3/uL — ABNORMAL LOW (ref 0.8–3.5)
ABS. MONOCYTES: 0.3 10*3/uL (ref 0.0–1.0)
ABS. NEUTROPHILS: 5.3 10*3/uL (ref 1.8–8.0)
BASOPHILS: 0 % (ref 0–1)
EOSINOPHILS: 1 % (ref 0–7)
HCT: 41.6 % (ref 36.6–50.3)
HGB: 14.7 g/dL (ref 12.1–17.0)
LYMPHOCYTES: 10 % — ABNORMAL LOW (ref 12–49)
MCH: 31 PG (ref 26.0–34.0)
MCHC: 35.3 g/dL (ref 30.0–36.5)
MCV: 87.8 FL (ref 80.0–99.0)
MONOCYTES: 5 % (ref 5–13)
NEUTROPHILS: 84 % — ABNORMAL HIGH (ref 32–75)
PLATELET: 132 10*3/uL — ABNORMAL LOW (ref 150–400)
RBC: 4.74 M/uL (ref 4.10–5.70)
RDW: 12.7 % (ref 11.5–14.5)
WBC: 6.3 10*3/uL (ref 4.1–11.1)

## 2015-02-19 LAB — LIPASE: Lipase: 146 U/L (ref 73–393)

## 2015-02-19 LAB — D DIMER: D-dimer: 9.84 mg/L FEU — ABNORMAL HIGH (ref 0.00–0.65)

## 2015-02-19 LAB — TROPONIN I: Troponin-I, Qt.: 0.04 ng/mL (ref <0.05)

## 2015-02-19 LAB — POC FECAL OCCULT BLOOD: Occult blood, stool (POC): NEGATIVE

## 2015-02-19 LAB — PROTHROMBIN TIME + INR
INR: 1.1 (ref 0.9–1.1)
Prothrombin time: 11.5 s — ABNORMAL HIGH (ref 9.0–11.1)

## 2015-02-19 LAB — CK W/ REFLX CKMB: CK: 41 U/L (ref 39–308)

## 2015-02-19 MED ORDER — SODIUM CHLORIDE 0.9 % IV
Freq: Once | INTRAVENOUS | Status: AC
Start: 2015-02-19 — End: 2015-02-19
  Administered 2015-02-19: 21:00:00 via INTRAVENOUS

## 2015-02-19 MED ORDER — SODIUM CHLORIDE 0.9 % IJ SYRG
INTRAMUSCULAR | Status: DC | PRN
Start: 2015-02-19 — End: 2015-02-21

## 2015-02-19 MED ORDER — DOCUSATE SODIUM 100 MG CAP
100 mg | Freq: Two times a day (BID) | ORAL | Status: DC
Start: 2015-02-19 — End: 2015-02-21
  Administered 2015-02-20 (×3): via ORAL

## 2015-02-19 MED ORDER — IPRATROPIUM-ALBUTEROL 2.5 MG-0.5 MG/3 ML NEB SOLUTION
2.5 mg-0.5 mg/3 ml | RESPIRATORY_TRACT | Status: DC | PRN
Start: 2015-02-19 — End: 2015-02-20

## 2015-02-19 MED ORDER — IOPAMIDOL 76 % IV SOLN
370 mg iodine /mL (76 %) | Freq: Once | INTRAVENOUS | Status: AC
Start: 2015-02-19 — End: 2015-02-19
  Administered 2015-02-19: 23:00:00 via INTRAVENOUS

## 2015-02-19 MED ORDER — SODIUM CHLORIDE 0.9 % IJ SYRG
Freq: Once | INTRAMUSCULAR | Status: AC
Start: 2015-02-19 — End: 2015-02-19
  Administered 2015-02-19: 23:00:00 via INTRAVENOUS

## 2015-02-19 MED ORDER — SODIUM CHLORIDE 0.9 % IV
Freq: Once | INTRAVENOUS | Status: AC
Start: 2015-02-19 — End: 2015-02-19
  Administered 2015-02-19: 23:00:00 via INTRAVENOUS

## 2015-02-19 MED ORDER — PANTOPRAZOLE 40 MG TAB, DELAYED RELEASE
40 mg | Freq: Every day | ORAL | Status: DC
Start: 2015-02-19 — End: 2015-02-21
  Administered 2015-02-20: 15:00:00 via ORAL

## 2015-02-19 MED ORDER — LEVOFLOXACIN IN D5W 750 MG/150 ML IV PIGGY BACK
750 mg/150 mL | INTRAVENOUS | Status: DC
Start: 2015-02-19 — End: 2015-02-20
  Administered 2015-02-20: via INTRAVENOUS

## 2015-02-19 MED ORDER — ACETAMINOPHEN 325 MG TABLET
325 mg | ORAL | Status: DC | PRN
Start: 2015-02-19 — End: 2015-02-20
  Administered 2015-02-20: 03:00:00 via ORAL

## 2015-02-19 MED ORDER — CODEINE-GUAIFENESIN 10 MG-100 MG/5 ML ORAL LIQUID
100-10 mg/5 mL | ORAL | Status: DC | PRN
Start: 2015-02-19 — End: 2015-02-20

## 2015-02-19 MED ORDER — ENOXAPARIN 30 MG/0.3 ML SUB-Q SYRINGE
30 mg/0.3 mL | SUBCUTANEOUS | Status: DC
Start: 2015-02-19 — End: 2015-02-21
  Administered 2015-02-20 – 2015-02-21 (×2): via SUBCUTANEOUS

## 2015-02-19 MED ORDER — SODIUM CHLORIDE 0.9 % IJ SYRG
Freq: Three times a day (TID) | INTRAMUSCULAR | Status: DC
Start: 2015-02-19 — End: 2015-02-21
  Administered 2015-02-20 – 2015-02-21 (×5): via INTRAVENOUS

## 2015-02-19 MED ORDER — IPRATROPIUM-ALBUTEROL 2.5 MG-0.5 MG/3 ML NEB SOLUTION
2.5 mg-0.5 mg/3 ml | RESPIRATORY_TRACT | Status: AC
Start: 2015-02-19 — End: 2015-02-19
  Administered 2015-02-19 (×2): via RESPIRATORY_TRACT

## 2015-02-19 MED ORDER — ONDANSETRON (PF) 4 MG/2 ML INJECTION
4 mg/2 mL | INTRAMUSCULAR | Status: DC | PRN
Start: 2015-02-19 — End: 2015-02-21
  Administered 2015-02-20 – 2015-02-21 (×3): via INTRAVENOUS

## 2015-02-19 MED FILL — SODIUM CHLORIDE 0.9 % IV: INTRAVENOUS | Qty: 1000

## 2015-02-19 MED FILL — IPRATROPIUM-ALBUTEROL 2.5 MG-0.5 MG/3 ML NEB SOLUTION: 2.5 mg-0.5 mg/3 ml | RESPIRATORY_TRACT | Qty: 3

## 2015-02-19 NOTE — ED Provider Notes (Signed)
HPI Comments: Brad Robles is a 81 y.o. male who presents ambulatory to [facility] ED with CC of mid-sternal CP x ~2 weeks, radiating to his left chest, and to his right chest with inspiration since this morning. Pt describes his pain as pressure and "rawness." He also reports cough x "a few days," as well as progressively improving hemoptysis x ~5-6 days. Pt also states he has been having progressively worsening SOB x ~2 weeks, which he states is exacerbated by exertion. Pt reports he took [facility] powder ~1230 today with mild improvement in his pain. Per pt's daughter, pt has had decreased PO intake since onset of his symptoms, and has progressively worsening weight loss x 1 year. Pt denies hx of DM, HTN, and hypercholesterolemia. Pt reports former tobacco and alcohol use. He specifically denies fever, lightheadedness, syncope, nausea, vomiting, leg swelling, and dysuria.    PCP: None    There are no other complaints, changes, or physical findings at this time.    The history is provided by the patient and a relative.        Past Medical History:   Diagnosis Date   ??? Seizures ([place])        Past Surgical History:   Procedure Laterality Date   ??? Hx colonoscopy  2011   ??? Hx orthopaedic       neck surgery for pinched nerves         History reviewed. No pertinent family history.    Social History     Social History   ??? Marital status: DIVORCED     Spouse name: N/A   ??? Number of children: N/A   ??? Years of education: N/A     Occupational History   ??? Not on file.     Social History Main Topics   ??? Smoking status: Never Smoker   ??? Smokeless tobacco: Not on file   ??? Alcohol use Yes      Comment: occasionally   ??? Drug use: No   ??? Sexual activity: Not on file     Other Topics Concern   ??? Not on file     Social History Narrative   ??? No narrative on file         ALLERGIES: Barium sulfate; Demerol [meperidine]; Novocain [procaine]; and Sulfa (sulfonamide antibiotics)    Review of Systems    Constitutional: Positive for appetite change (decreased) and unexpected weight change (loss). Negative for chills and fever.   HENT: Negative for congestion.    Eyes: Negative for itching and visual disturbance.   Respiratory: Positive for cough (+hemoptysis) and shortness of breath. Negative for chest tightness.    Cardiovascular: Positive for chest pain. Negative for leg swelling.   Gastrointestinal: Negative for abdominal pain and vomiting.   Endocrine: Negative for polyuria.   Genitourinary: Negative for dysuria and frequency.   Musculoskeletal: Negative for myalgias.   Skin: Negative for color change.   Allergic/Immunologic: Negative for immunocompromised state.   Neurological: Negative for syncope, light-headedness and numbness.       Patient Vitals for the past 12 hrs:   Temp Pulse Resp BP SpO2   02/19/15 1614 - 82 12 - 96 %   02/19/15 1613 - - - 115/81 -   02/19/15 1545 - 80 13 (!) 127/93 96 %   02/19/15 1442 98.1 ??F (36.7 ??C) - 18 160/90 99 %            Physical Exam     Nursing note and  vitals reviewed.  General appearance: thin, cachectic; non-toxic, NAD  Eyes: PERRL, EOMI, conjunctiva normal, anicteric sclera  HENT: edentulous upper mouth; mucous membranes moist, oropharynx is clear  Pulmonary: mild tachypnea; scattered expiratory wheezes throughout all lung fields; good air exchange, no crackles  Cardiac: borderline tachycardia; regular rhythm, no murmurs, gallops, or rubs, 2+ peripheral pulses; cap refill brisk, no splinter hemorrhaging  Abdomen: soft, nontender, nondistended, bowel sounds present; no CVA tenderness  MSK: no pretibial edema; no calf/thigh pain  GU: no external hemorrhoids; prostate firm, no bogginess, minimally enlarged  Neuro: Alert, answers questions appropriately      MDM  Number of Diagnoses or Management Options  Abnormal EKG:   Hemoptysis:   Mass of left lung:   Shortness of breath:   Diagnosis management comments: DDx: PNA, bronchitis, lung CA, PE, ACS        Amount and/or Complexity of Data Reviewed  Clinical lab tests: ordered and reviewed  Tests in the radiology section of CPT??: ordered and reviewed  Tests in the medicine section of CPT??: ordered and reviewed  Obtain history from someone other than the patient: yes (Daughter)  Review and summarize past medical records: yes  Discuss the patient with other providers: yes (Hospitalist)  Independent visualization of images, tracings, or specimens: yes      ED Course       Procedures     EKG interpretation: (Preliminary)  14:48  Rhythm: normal sinus rhythm and RBBB and bifascicular block; and regular . Rate (approx.): 92; Axis: normal; PR interval: normal, 156 ms; QRS interval: normal, 130 ms; ST/T wave: T wave inverted; in  Lead: V1, V2 and V3; Other findings: QT/QTc 386/477.    Procedure Note - Rectal Exam:   3:03 PM  Performed by: Ellin Mayhew. Lavone Neri, MD  Rectal exam performed.  Brown stool was collected.  Stool was Hemoccult tested, and found to be heme Negative.   The procedure took 1-15 minutes, and pt tolerated well.    EKG interpretation: (Preliminary)  16:19  Rhythm: normal sinus rhythm, RBBB, bifascicular block, and PAC's; and regular . Rate (approx.): 80; Axis: normal; PR interval: normal, 158 ms; QRS interval: normal, 128 ms; ST/T wave: T wave inverted; in  Lead: V1, V2 and V3; Other findings: unchanged from previous ekg; QT/QTc 414/477 ms     6:11 PM  Spoke with Radiology; pt's CTA negative for PE, shows spiculated left lower lung mass, concerning for malignancy.     CONSULT NOTE:   6:20 PM  Ellin Mayhew. Lavone Neri, MD spoke with Dr. Helayne Seminole,   Specialty: Hospitalist  Discussed pt's hx, disposition, and available diagnostic and imaging results. Reviewed care plans. Consultant will evaluate pt for admission.    LABORATORY TESTS:  Recent Results (from the past 12 hour(s))   EKG, 12 LEAD, INITIAL    Collection Time: 02/19/15  2:48 PM   Result Value Ref Range    Ventricular Rate 92 BPM    Atrial Rate 92 BPM     P-R Interval 156 ms    QRS Duration 130 ms    Q-T Interval 386 ms    QTC Calculation (Bezet) 477 ms    Calculated P Axis 67 degrees    Calculated R Axis 175 degrees    Calculated T Axis 39 degrees    Diagnosis       Normal sinus rhythm  Right bundle branch block  Left posterior fascicular block  ** Bifascicular block **  Possible Inferior infarct , age undetermined  Abnormal ECG  No previous ECGs available     CBC WITH AUTOMATED DIFF    Collection Time: 02/19/15  3:25 PM   Result Value Ref Range    WBC 6.3 4.1 - 11.1 K/uL    RBC 4.74 4.10 - 5.70 M/uL    HGB 14.7 12.1 - 17.0 g/dL    HCT 41.6 36.6 - 50.3 %    MCV 87.8 80.0 - 99.0 FL    MCH 31.0 26.0 - 34.0 PG    MCHC 35.3 30.0 - 36.5 g/dL    RDW 12.7 11.5 - 14.5 %    PLATELET 132 (L) 150 - 400 K/uL    NEUTROPHILS 84 (H) 32 - 75 %    LYMPHOCYTES 10 (L) 12 - 49 %    MONOCYTES 5 5 - 13 %    EOSINOPHILS 1 0 - 7 %    BASOPHILS 0 0 - 1 %    ABS. NEUTROPHILS 5.3 1.8 - 8.0 K/UL    ABS. LYMPHOCYTES 0.6 (L) 0.8 - 3.5 K/UL    ABS. MONOCYTES 0.3 0.0 - 1.0 K/UL    ABS. EOSINOPHILS 0.1 0.0 - 0.4 K/UL    ABS. BASOPHILS 0.0 0.0 - 0.1 K/UL    RBC COMMENTS NORMOCYTIC, NORMOCHROMIC      DF SMEAR SCANNED     PROTHROMBIN TIME + INR    Collection Time: 02/19/15  3:25 PM   Result Value Ref Range    INR 1.1 0.9 - 1.1      Prothrombin time 11.5 (H) 9.0 - 09.8 sec   METABOLIC PANEL, COMPREHENSIVE    Collection Time: 02/19/15  3:25 PM   Result Value Ref Range    Sodium 137 136 - 145 mmol/L    Potassium 4.2 3.5 - 5.1 mmol/L    Chloride 104 97 - 108 mmol/L    CO2 27 21 - 32 mmol/L    Anion gap 6 5 - 15 mmol/L    Glucose 98 65 - 100 mg/dL    BUN 13 6 - 20 MG/DL    Creatinine 0.75 0.70 - 1.30 MG/DL    BUN/Creatinine ratio 17 12 - 20      GFR est AA >60 >60 ml/min/1.58m    GFR est non-AA >60 >60 ml/min/1.772m   Calcium 8.5 8.5 - 10.1 MG/DL    Bilirubin, total 0.5 0.2 - 1.0 MG/DL    ALT 14 12 - 78 U/L    AST 18 15 - 37 U/L    Alk. phosphatase 89 45 - 117 U/L    Protein, total 6.8 6.4 - 8.2 g/dL     Albumin 3.2 (L) 3.5 - 5.0 g/dL    Globulin 3.6 2.0 - 4.0 g/dL    A-G Ratio 0.9 (L) 1.1 - 2.2     LIPASE    Collection Time: 02/19/15  3:25 PM   Result Value Ref Range    Lipase 146 73 - 393 U/L   D DIMER    Collection Time: 02/19/15  3:25 PM   Result Value Ref Range    D-dimer 9.84 (H) 0.00 - 0.65 mg/L FEU   CK W/ REFLX CKMB    Collection Time: 02/19/15  3:25 PM   Result Value Ref Range    CK 41 39 - 308 U/L   TROPONIN I    Collection Time: 02/19/15  3:25 PM   Result Value Ref Range    Troponin-I, Qt. 0.04 <0.05 ng/mL   POC FECAL OCCULT BLOOD  Collection Time: 02/19/15  4:13 PM   Result Value Ref Range    Occult blood, stool (POC) NEGATIVE  NEG     EKG, 12 LEAD, SUBSEQUENT    Collection Time: 02/19/15  4:19 PM   Result Value Ref Range    Ventricular Rate 80 BPM    Atrial Rate 80 BPM    P-R Interval 158 ms    QRS Duration 128 ms    Q-T Interval 414 ms    QTC Calculation (Bezet) 477 ms    Calculated P Axis 61 degrees    Calculated R Axis 160 degrees    Calculated T Axis 52 degrees    Diagnosis       Sinus rhythm with premature atrial complexes  Right bundle branch block  Left posterior fascicular block  ** Bifascicular block **  Cannot rule out Inferior infarct , age undetermined  Abnormal ECG  When compared with ECG of 19-Feb-2015 14:48,  MANUAL COMPARISON REQUIRED, DATA IS UNCONFIRMED     URINALYSIS W/MICROSCOPIC    Collection Time: 02/19/15  5:01 PM   Result Value Ref Range    Color YELLOW/STRAW      Appearance CLOUDY (A) CLEAR      Specific gravity 1.016 1.003 - 1.030      pH (UA) 6.0 5.0 - 8.0      Protein NEGATIVE  NEG mg/dL    Glucose NEGATIVE  NEG mg/dL    Ketone NEGATIVE  NEG mg/dL    Bilirubin NEGATIVE  NEG      Blood NEGATIVE  NEG      Urobilinogen 1.0 0.2 - 1.0 EU/dL    Nitrites NEGATIVE  NEG      Leukocyte Esterase SMALL (A) NEG      WBC 5-10 0 - 4 /hpf    RBC 0-5 0 - 5 /hpf    Epithelial cells FEW FEW /lpf    Bacteria NEGATIVE  NEG /hpf       IMAGING RESULTS:  CT Results  (Last 48 hours)                02/19/15 1731  CTA CHEST W WO CONT Final result    Impression:  IMPRESSION:    1. Negative for pulmonary embolus.   2. 3.1 cm spiculated mass in the left lower lobe highly worrisome for   malignancy. Biopsy is recommended for further evaluation.   3. 1.2 cm nodule in the lateral left upper lobe.   4. Several small juxtapleural nodules along the margin of the left lung.   5. 4 mm pulmonary nodule in the right apex.   6. Small left pleural effusion.       Findings were called to Dr. Lavone Neri at 6:10 PM on 02/19/2015.                   Narrative:  EXAM:  CTA CHEST W WO CONT       INDICATION:   hemoptysis, CP, elevated d dimer       COMPARISON: None.       CONTRAST:  85 mL of Isovue-370.       TECHNIQUE:    Precontrast scout images were obtained to localize the volume for acquisition.   Multislice helical CT arteriography was performed from the diaphragm to the   thoracic inlet during uneventful rapid bolus intravenous contrast   administration. Lung and soft tissue windows were generated.  Coronal and   sagittal images were generated and 3D post processing  consisting of coronal   maximum intensity images was performed.  CT dose reduction was achieved through   use of a standardized protocol tailored for this examination and automatic   exposure control for dose modulation.       FINDINGS:   The visualized thyroid gland is unremarkable. The heart is normal size. The   thoracic aorta shows no evidence of aneurysm or dissection. The pulmonary   arteries are well opacified. No evidence of pulmonary embolus. There are several   mildly enlarged lymph nodes in the left hilum with the largest measuring 1.1 x   1.9 cm on series 2 image 66.       The patient is status post cholecystectomy. The remainder the visualized   subdiaphragmatic viscera is unremarkable.       There is a 4 mm pulmonary nodule in the lateral right upper lobe on image 96.   The right lung is otherwise clear. There is a small left pleural effusion. There    is a 3.1 x 1.9 cm spiculated mass in the lateral aspect of the left lower lobe   on image 41. There is a 1.0 x 1.2 cm nodule in the lateral left upper lobe on   image 57. Small foci of atelectasis are seen in the most inferior left lung   base. Small areas of juxtapleural nodularity are seen along the margin of the   left lung.       The patient is status post anterior cervical fusion. Mild multilevel spondylosis   is seen in the rest spine. No evidence of a destructive osseous lesion.               CXR Results  (Last 48 hours)               02/19/15 1604  XR CHEST PA LAT Final result    Impression:  IMPRESSION: There is pleural density in the left hemithorax. This may be   chronic, however there are no previous studies available for comparison of this.   There is no acute parenchymal process. The lungs are hyperaerated.               Narrative:  EXAM:  XR CHEST PA LAT       INDICATION:   cp, sob, hemoptysis       COMPARISON: None.       FINDINGS: PA and lateral radiographs of the chest demonstrate pleural-based   density on the left as well as in the left costophrenic angle and tenting of the   left hemidiaphragm. There is no previous study for comparison of this. This may   be chronic. No acute parenchymal process is identified. The lungs are   hyperaerated.Marland Kitchen Heart size is normal. Atherosclerotic change of the aorta is   noted...  Degenerative changes of the thoracic spine noted. Previous cervical   fusion noted.Marland Kitchen                  MEDICATIONS GIVEN:  Medications   sodium chloride (NS) flush 5-10 mL (not administered)   sodium chloride (NS) flush 5-10 mL (not administered)   acetaminophen (TYLENOL) tablet 650 mg (not administered)   ondansetron (ZOFRAN) injection 4 mg (not administered)   docusate sodium (COLACE) capsule 100 mg (not administered)   enoxaparin (LOVENOX) injection 30 mg (not administered)   levoFLOXacin (LEVAQUIN) 750 mg in D5W IVPB (not administered)    albuterol-ipratropium (DUO-NEB) 2.5 MG-0.5 MG/3 ML (not administered)   pantoprazole (  PROTONIX) tablet 40 mg (not administered)   guaiFENesin-codeine (ROBITUSSIN AC) 100-10 mg/5 mL solution 10 mL (not administered)   albuterol-ipratropium (DUO-NEB) 2.5 MG-0.5 MG/3 ML (3 mL Nebulization Given 02/19/15 1634)   0.9% sodium chloride infusion 1,000 mL (1,000 mL IntraVENous New Bag 02/19/15 1619)   0.9% sodium chloride infusion (50 mL/hr IntraVENous New Bag 02/19/15 1730)   iopamidol (ISOVUE-370) 76 % injection 100 mL (100 mL IntraVENous Given 02/19/15 1730)   sodium chloride (NS) flush 10 mL (10 mL IntraVENous Given 02/19/15 1730)       IMPRESSION:  1. Shortness of breath    2. Mass of left lung    3. Hemoptysis    4. Abnormal EKG        PLAN:  1. Admit to Hospitalist    ADMIT NOTE:  6:20 PM  The patient is being admitted to the hospital by Dr. Rodd.  The results of their tests and reasons for their admission have been discussed with the patient and/or available family.  They convey agreement and understanding for the need to be admitted and for their admission diagnosis.        This note is prepared by Malon Kindle, acting as Education administrator for [employer]. Lavone Neri, MD.    Ellin Mayhew. Lavone Neri, MD: The scribe's documentation has been prepared under my direction and personally reviewed by me in its entirety. I confirm that the note above accurately reflects all work, treatment, procedures, and medical decision making performed by me.

## 2015-02-19 NOTE — ED Notes (Signed)
Pt placed on cardiac monitor x 3. Daughter at bedside.

## 2015-02-19 NOTE — ED Notes (Signed)
Pt OTF in CT scan.

## 2015-02-19 NOTE — ED Notes (Signed)
Lab notified to add on CK and trop.

## 2015-02-19 NOTE — ED Notes (Signed)
Pt given dinner tray.

## 2015-02-19 NOTE — ED Notes (Signed)
Report called to [employer].

## 2015-02-19 NOTE — ED Notes (Signed)
Pt OOB to bathroom. Instructed on clean catch.

## 2015-02-19 NOTE — ED Notes (Addendum)
Pt states chest pain that started back 2nd week of November. States this episode worsened over last few days. States SOB. States pain is worse with deep breaths. States pain goes from left ribcage up into left chest.

## 2015-02-19 NOTE — Other (Signed)
TRANSFER - OUT REPORT:    Verbal report given to Diana RN(name) on Jamai Dolce  being transferred to Oncology(unit) for routine progression of care       Report consisted of patient???s Situation, Background, Assessment and   Recommendations(SBAR).     Information from the following report(s) SBAR, ED Summary, [facility] and Recent Results was reviewed with the receiving nurse.    Lines:   Peripheral IV 02/19/15 Right Antecubital (Active)   Site Assessment Clean, dry, & intact 02/19/2015  3:26 PM   Phlebitis Assessment 0 02/19/2015  3:26 PM   Infiltration Assessment 0 02/19/2015  3:26 PM   Dressing Status Clean, dry, & intact 02/19/2015  3:26 PM   Dressing Type Tape;Transparent 02/19/2015  3:26 PM   Hub Color/Line Status Pink;Flushed;Patent 02/19/2015  3:26 PM   Action Taken Blood drawn 02/19/2015  3:26 PM        Opportunity for questions and clarification was provided.      Patient transported with:   [employer]

## 2015-02-19 NOTE — H&P (Signed)
[place] [address]   HISTORY AND PHYSICAL       Name:  Brad Robles, Brad Robles   MR#:  [id number]   DOB:  March 08, 1934   Account #:  [id number]        Date of Adm:  02/19/2015       CHIEF COMPLAINT: Chest pain.    HISTORY OF PRESENT ILLNESS: This is a 81 year old Caucasian   male with no significant past medical history, who was presented to the   emergency department with a 2-week history of midsternal   nonradiating chest pain. The patient states his symptoms are associated   with nonproductive cough, shortness of breath and hemoptysis   for the past 2 weeks. The patient reports having progressively   worsening weight loss for 1 year, and has lost 15 pounds for the last 2 months.   The patient denies palpitations, dyspnea on exertion, orthopnea or   paroxysmal nocturnal dyspnea, diaphoresis, dizziness, syncope, fever,   chills, nausea, vomiting, diarrhea, wheezing, blurry vision, headaches,   frequency, myalgias or weakness.    PAST MEDICAL HISTORY: None.    MEDICATIONS: None.    ALLERGIES: BARIUM SULFATE.    FAMILY HISTORY: No family history of coronary artery disease,   diabetes, or hypertension.    SOCIAL HISTORY: No alcohol or recreational drugs. He was a   smoker, quit smoking in 1979.    REVIEW OF SYSTEMS   GENERAL: Positive for weight loss and loss of appetite. No fevers,   chills, malaise, fatigue, night sweats, easy bruising, or   lymphadenopathy.   SKIN: No rashes, skin discoloration, pruritus or lump.   HEENT: No headache, dizziness, visual changes, tinnitus, vertigo,   hearing loss, nose bleed, postnasal drip, or rhinitis.   RESPIRATORY: Positive cough, positive shortness of breath, positive   hemoptysis. No wheezing.   CARDIOVASCULAR: Positive chest pain. No palpitations, dyspnea on   exertion, orthopnea, paroxysmal nocturnal dyspnea, diaphoresis,   edema, or claudication.   GASTROINTESTINAL: No abdominal pain, nausea, vomiting, diarrhea,    constipation, indigestion, dysphagia, hematemesis, melena, or   hematochezia.   GENITOURINARY: No dysuria, frequency, hematuria, or discharges.   ENDOCRINE: No polyuria, no polydipsia, heat intolerance, or goiter.   MUSCULOSKELETAL: No arthralgia or myalgias, joint pain or   swelling.   NEUROLOGIC: No weakness, seizure, syncope, loss of   consciousness.   PSYCHOLOGICAL: No anxiety, depression, insomnia, memory loss or   hallucinations.    PHYSICAL EXAMINATION   GENERAL: Well-developed, well-nourished, in no acute distress.   SKIN: Warm and dry, normal turgor. No rashes, scar, lesion,   ecchymosis, petechiae or purpura.   HEENT: Moist mucous membranes. Normocephalic, atraumatic. Pupils   equal, round and reactive to light and accommodation. Extraocular   movement in intact. ENT unremarkable.   NECK: Supple. Trachea midline. No jugular venous distention. No   lymphadenopathy, thyromegaly, masses, bruit, rigidity.   LUNGS: Decreased breath sounds. Positive rhonchi bilaterally. No rales.   HEART: Regular rate and rhythm, 2/6 systolic ejection murmur at the   left sternal border. No S3, S4.   ABDOMEN: Soft, normal bowel sounds. No tenderness, distention,   masses, bruit, hepatosplenomegaly, guarding, rigidity, rebounding.   EXTREMITIES: No clubbing, cyanosis, edema. Normal range or   motion. Peripheral pulses are full and symmetrical. No ulceration,   gangrene, joint swelling or tenderness.   NEUROLOGIC: Cranial nerves 2-12 are intact. No motor or sensory   deficit. Positive light touch  and vibration. Deep tendon reflexes are   normal.   PSYCHOLOGICAL: Alert and oriented to person, place and time.   Appropriate mood and affect.    LABORATORY DATA: WBC 6.3, hemoglobin 14.7, hematocrit 41.6,   platelet count 132. Sodium 137, potassium 4.2, chloride   104, bicarbonate 27, anion gap 6, glucose 98, BUN 13, creatinine 0.75.   Urinalysis revealed leukocyte esterase positive. Chest x-ray revealed    there is a pleural density in the left hemithorax. Chest CTA consistent   with no pulmonary embolism, 3.1 cm spiculated mass in the left lower lobe   along with multiple pulmonary nodules.    ASSESSMENT   1. Left lower lobe mass and multiple pulmonary nodules.   2. Left pleural effusion.   3. Urinary tract infection.   4. Thrombocytopenia.    PLAN: We will admit the patient to medical floor and will start on the IV   Levaquin, bronchodilators, oxygen. Obtain blood and urine cultures.   Magnesium, TSH, lipid panel, hemoglobin A1c. Request a pulmonary   consultation and monitor respiratory status closely. Deep venous   thrombosis prophylaxis with Lovenox and gastrointestinal prophylaxis   with proton pump inhibitor provided. Code status is FULL CODE.        Inocente Salles Kallon Caylor, MD      SR / RA   D:  02/19/2015   19:11   T:  02/19/2015   20:33   Job #:  [id number]

## 2015-02-20 ENCOUNTER — Inpatient Hospital Stay: Payer: MEDICARE | Primary: Family Medicine

## 2015-02-20 LAB — METABOLIC PANEL, BASIC
Anion gap: 9 mmol/L (ref 5–15)
BUN/Creatinine ratio: 13 (ref 12–20)
BUN: 9 MG/DL (ref 6–20)
CO2: 25 mmol/L (ref 21–32)
Calcium: 8.5 MG/DL (ref 8.5–10.1)
Chloride: 104 mmol/L (ref 97–108)
Creatinine: 0.67 MG/DL — ABNORMAL LOW (ref 0.70–1.30)
GFR est AA: >60 mL/min/{1.73_m2} (ref >60)
GFR est non-AA: >60 mL/min/{1.73_m2} (ref >60)
Glucose: 104 mg/dL — ABNORMAL HIGH (ref 65–100)
Potassium: 4 mmol/L (ref 3.5–5.1)
Sodium: 138 mmol/L (ref 136–145)

## 2015-02-20 LAB — CBC WITH AUTOMATED DIFF
ABS. BASOPHILS: 0 10*3/uL (ref 0.0–0.1)
ABS. EOSINOPHILS: 0.1 10*3/uL (ref 0.0–0.4)
ABS. LYMPHOCYTES: 0.9 10*3/uL (ref 0.8–3.5)
ABS. MONOCYTES: 0.4 10*3/uL (ref 0.0–1.0)
ABS. NEUTROPHILS: 3.1 10*3/uL (ref 1.8–8.0)
BASOPHILS: 0 % (ref 0–1)
EOSINOPHILS: 3 % (ref 0–7)
HCT: 38.2 % (ref 36.6–50.3)
HGB: 13.5 g/dL (ref 12.1–17.0)
LYMPHOCYTES: 20 % (ref 12–49)
MCH: 30.8 PG (ref 26.0–34.0)
MCHC: 35.3 g/dL (ref 30.0–36.5)
MCV: 87 FL (ref 80.0–99.0)
MONOCYTES: 8 % (ref 5–13)
NEUTROPHILS: 69 % (ref 32–75)
PLATELET: 121 10*3/uL — ABNORMAL LOW (ref 150–400)
RBC: 4.39 M/uL (ref 4.10–5.70)
RDW: 12.8 % (ref 11.5–14.5)
WBC: 4.4 10*3/uL (ref 4.1–11.1)

## 2015-02-20 LAB — EKG, 12 LEAD, INITIAL
Atrial Rate: 92 {beats}/min
Calculated P Axis: 67 degrees
Calculated R Axis: 175 degrees
Calculated T Axis: 39 degrees
Diagnosis: NORMAL
P-R Interval: 156 ms
Q-T Interval: 386 ms
QRS Duration: 130 ms
QTC Calculation (Bezet): 477 ms
Ventricular Rate: 92 {beats}/min

## 2015-02-20 LAB — EKG, 12 LEAD, SUBSEQUENT
Atrial Rate: 80 {beats}/min
Calculated P Axis: 61 degrees
Calculated R Axis: 160 degrees
Calculated T Axis: 52 degrees
P-R Interval: 158 ms
Q-T Interval: 414 ms
QRS Duration: 128 ms
QTC Calculation (Bezet): 477 ms
Ventricular Rate: 80 {beats}/min

## 2015-02-20 LAB — HEMOGLOBIN A1C WITH EAG
Est. average glucose: 100 mg/dL
Hemoglobin A1c: 5.1 % (ref 4.2–6.3)

## 2015-02-20 LAB — TROPONIN I: Troponin-I, Qt.: 0.1 ng/mL — ABNORMAL HIGH (ref <0.05)

## 2015-02-20 LAB — LIPID PANEL
CHOL/HDL Ratio: 3.5 (ref 0–5.0)
Cholesterol, total: 159 MG/DL (ref <200)
HDL Cholesterol: 45 MG/DL
LDL, calculated: 94.6 MG/DL (ref 0–100)
Triglyceride: 97 MG/DL (ref <150)
VLDL, calculated: 19.4 MG/DL

## 2015-02-20 LAB — TSH 3RD GENERATION: TSH: 3.86 u[IU]/mL — ABNORMAL HIGH (ref 0.36–3.74)

## 2015-02-20 LAB — MAGNESIUM: Magnesium: 2.2 mg/dL (ref 1.6–2.4)

## 2015-02-20 MED ORDER — IPRATROPIUM-ALBUTEROL 2.5 MG-0.5 MG/3 ML NEB SOLUTION
2.5 mg-0.5 mg/3 ml | Freq: Four times a day (QID) | RESPIRATORY_TRACT | Status: DC
Start: 2015-02-20 — End: 2015-02-21
  Administered 2015-02-20: 19:00:00 via RESPIRATORY_TRACT

## 2015-02-20 MED ORDER — CYCLOBENZAPRINE 10 MG TAB
10 mg | Freq: Three times a day (TID) | ORAL | Status: DC | PRN
Start: 2015-02-20 — End: 2015-02-21
  Administered 2015-02-21: 08:00:00 via ORAL

## 2015-02-20 MED ORDER — UMECLIDINIUM 62.5 MCG/ACTUATION BLISTER POWDER FOR INHALATION
62.5 mcg/actuation | Freq: Every day | RESPIRATORY_TRACT | Status: DC
Start: 2015-02-20 — End: 2015-02-21
  Administered 2015-02-21: 16:00:00 via RESPIRATORY_TRACT

## 2015-02-20 MED ORDER — OXYCODONE-ACETAMINOPHEN 5 MG-325 MG TAB
5-325 mg | ORAL | Status: DC | PRN
Start: 2015-02-20 — End: 2015-02-21
  Administered 2015-02-20 – 2015-02-21 (×4): via ORAL

## 2015-02-20 MED ORDER — PHARMACY INFORMATION NOTE
Freq: Once | Status: AC
Start: 2015-02-20 — End: 2015-02-20
  Administered 2015-02-20: 20:00:00

## 2015-02-20 MED ORDER — LEVOFLOXACIN 750 MG TAB
750 mg | Freq: Every day | ORAL | Status: DC
Start: 2015-02-20 — End: 2015-02-21
  Administered 2015-02-20: 23:00:00 via ORAL

## 2015-02-20 MED ORDER — OXYCODONE-ACETAMINOPHEN 5 MG-325 MG TAB
5-325 mg | ORAL | Status: DC | PRN
Start: 2015-02-20 — End: 2015-02-20

## 2015-02-20 MED ORDER — ACETAMINOPHEN 325 MG TABLET
325 mg | ORAL | Status: DC | PRN
Start: 2015-02-20 — End: 2015-02-21
  Administered 2015-02-20: 23:00:00 via ORAL

## 2015-02-20 MED ORDER — CODEINE-GUAIFENESIN 10 MG-100 MG/5 ML ORAL LIQUID
100-10 mg/5 mL | Freq: Three times a day (TID) | ORAL | Status: DC
Start: 2015-02-20 — End: 2015-02-21
  Administered 2015-02-20: 23:00:00 via ORAL

## 2015-02-20 MED ORDER — ASPIRIN 81 MG CHEWABLE TAB
81 mg | Freq: Once | ORAL | Status: AC
Start: 2015-02-20 — End: 2015-02-20
  Administered 2015-02-20: 08:00:00 via ORAL

## 2015-02-20 MED ORDER — FOLIC ACID 1 MG TAB
1 mg | Freq: Every day | ORAL | Status: DC
Start: 2015-02-20 — End: 2015-02-21
  Administered 2015-02-20 – 2015-02-21 (×2): via ORAL

## 2015-02-20 MED ORDER — BUTALBITAL-ACETAMINOPHEN-CAFFEINE 50 MG-325 MG-40 MG TAB
50-325-40 mg | Freq: Three times a day (TID) | ORAL | Status: DC | PRN
Start: 2015-02-20 — End: 2015-02-21
  Administered 2015-02-20: 23:00:00 via ORAL

## 2015-02-20 MED FILL — DOK 100 MG CAPSULE: 100 mg | ORAL | Qty: 1

## 2015-02-20 MED FILL — OXYCODONE-ACETAMINOPHEN 5 MG-325 MG TAB: 5-325 mg | ORAL | Qty: 2

## 2015-02-20 MED FILL — ONDANSETRON (PF) 4 MG/2 ML INJECTION: 4 mg/2 mL | INTRAMUSCULAR | Qty: 2

## 2015-02-20 MED FILL — INCRUSE ELLIPTA 62.5 MCG/ACTUATION POWDER FOR INHALATION: 62.5 mcg/actuation | RESPIRATORY_TRACT | Qty: 7

## 2015-02-20 MED FILL — BD POSIFLUSH NORMAL SALINE 0.9 % INJECTION SYRINGE: INTRAMUSCULAR | Qty: 10

## 2015-02-20 MED FILL — LEVOFLOXACIN 750 MG TAB: 750 mg | ORAL | Qty: 1

## 2015-02-20 MED FILL — ENOXAPARIN 30 MG/0.3 ML SUB-Q SYRINGE: 30 mg/0.3 mL | SUBCUTANEOUS | Qty: 0.3

## 2015-02-20 MED FILL — BUTALBITAL-ACETAMINOPHEN-CAFFEINE 50 MG-325 MG-40 MG TAB: 50-325-40 mg | ORAL | Qty: 2

## 2015-02-20 MED FILL — PROTONIX 40 MG TABLET,DELAYED RELEASE: 40 mg | ORAL | Qty: 1

## 2015-02-20 MED FILL — CODEINE-GUAIFENESIN 10 MG-100 MG/5 ML ORAL LIQUID: 100-10 mg/5 mL | ORAL | Qty: 10

## 2015-02-20 MED FILL — LEVOFLOXACIN IN D5W 750 MG/150 ML IV PIGGY BACK: 750 mg/150 mL | INTRAVENOUS | Qty: 150

## 2015-02-20 MED FILL — IPRATROPIUM-ALBUTEROL 2.5 MG-0.5 MG/3 ML NEB SOLUTION: 2.5 mg-0.5 mg/3 ml | RESPIRATORY_TRACT | Qty: 3

## 2015-02-20 MED FILL — FOLIC ACID 1 MG TAB: 1 mg | ORAL | Qty: 1

## 2015-02-20 MED FILL — CHILDREN'S ASPIRIN 81 MG CHEWABLE TABLET: 81 mg | ORAL | Qty: 2

## 2015-02-20 MED FILL — PHARMACY INFORMATION NOTE: Qty: 1

## 2015-02-20 MED FILL — MAPAP (ACETAMINOPHEN) 325 MG TABLET: 325 mg | ORAL | Qty: 2

## 2015-02-20 NOTE — Progress Notes (Addendum)
Per IR, biopsy will be done tomorrow since patient ate even a bite or two this morning. Patient will be NPO after MN. Dr. Marolyn Haller made aware.

## 2015-02-20 NOTE — Progress Notes (Addendum)
Hospitalist Progress Note    NAME: Brad Robles   DOB:  1934-10-21   MRN:  [id number]       Interim Hospital Summary: 81 y.o. male whom presented on 02/19/2015 with  Lung Mass, COPD     Assessment / Plan:  Left lower lobe mass and multiple pulmonary nodules. Needs CT guided Biopsy, planned for tomorrow. Most likely malignant, Pulmonology help appreciated.  COPD Exacerbation: ex smoker, emphysematous changes noted in cT, c/w LVQ, bronchodilators, add Incruse  Left pleural effusion.  Very minimal not amenable for drainage.  Urinary tract infection. No clear cut in Dx, c/w LVQ, F/U Cultures.  Thrombocytopenia. Start folic acid.  Dementia? Paranoic Ideation?: non disruptive, will monitor for now.  GERD: patient been taking ASA compounds for pain, start PPI.  Surrogate: DTR Sandy [phone]  Code status: Full  Prophylaxis: Lovenox  Recommended Disposition: Home w/Family     Subjective:     Chief Complaint / Reason for Physician Visit  "I have a headache, cough and chest pain".  Discussed with RN events overnight.     Review of Systems:  Symptom Y/N Comments  Symptom Y/N Comments   Fever/Chills    Chest Pain y    Poor Appetite    Edema     Cough y   Abdominal Pain     Sputum    Joint Pain     SOB/DOE    Pruritis/Rash     Nausea/vomit    Tolerating PT/OT     Diarrhea    Tolerating Diet y    Constipation    Other       Could NOT obtain due to:      Objective:     VITALS:   Last 24hrs VS reviewed since prior progress note. Most recent are:  Patient Vitals for the past 24 hrs:   Temp Pulse Resp BP SpO2   02/20/15 0601 97.7 ??F (36.5 ??C) 71 18 128/84 97 %   02/19/15 2100 97.5 ??F (36.4 ??C) 85 20 126/77 96 %   02/19/15 1939 - 94 23 - 96 %   02/19/15 1900 - 96 20 116/73 95 %   02/19/15 1830 - 99 19 109/73 94 %   02/19/15 1800 - (!) 104 17 127/74 95 %   02/19/15 1614 - 82 12 - 96 %   02/19/15 1613 - - - 115/81 -   02/19/15 1545 - 80 13 (!) 127/93 96 %   02/19/15 1442 98.1 ??F (36.7 ??C) - 18 160/90 99 %      No intake or output data in the 24 hours ending 02/20/15 1037     PHYSICAL EXAM:  General: WD, WN. Alert, cooperative, no acute distress????  EENT:  EOMI. Anicteric sclerae. MMM  Resp:  Coarse BS, rhonchi, wheezing  CV:  Regular  rhythm,?? No edema  GI:  Soft, Non distended, Non tender. ??+Bowel sounds  Neurologic:?? Alert and oriented X 3, normal speech,   Psych:???? Good insight.??Not anxious nor agitated  Skin:  No rashes.  No jaundice    Reviewed most current lab test results and cultures  YES  Reviewed most current radiology test results   YES  Review and summation of old records today    NO  Reviewed patient's current orders and MAR    YES  PMH/SH reviewed - no change compared to H&P  ________________________________________________________________________  Care Plan discussed with:    Comments   Patient y    Family  y DTR  RN y    Surveyor, quantity                        Multidiciplinary team rounds were held today with case manager, nursing, pharmacist and Occupational psychologist.  Patient's plan of care was discussed; medications were reviewed and discharge planning was addressed.     ________________________________________________________________________  Total NON critical care TIME:  35   Minutes    Total CRITICAL CARE TIME Spent:   Minutes non procedure based      Comments   >50% of visit spent in counseling and coordination of care     ________________________________________________________________________  Amelia Jo, MD     Procedures: see electronic medical records for all procedures/Xrays and details which were not copied into this note but were reviewed prior to creation of Plan.      LABS:  I reviewed today's most current labs and imaging studies.  Pertinent labs include:  Recent Labs      02/20/15   0307  02/19/15   1525   WBC  4.4  6.3   HGB  13.5  14.7   HCT  38.2  41.6   PLT  121*  132*     Recent Labs      02/20/15   0307  02/19/15   1525   NA  138  137   K  4.0  4.2   CL  104  104    CO2  25  27   GLU  104*  98   BUN  9  13   CREA  0.67*  0.75   CA  8.5  8.5   MG  2.2   --    ALB   --   3.2*   TBILI   --   0.5   SGOT   --   18   ALT   --   14   INR   --   1.1       Signed: Amelia Jo, MD

## 2015-02-20 NOTE — Progress Notes (Signed)
Oncology Nursing Communication Tool      12:36 AM  02/20/2015     Bedside and Verbal shift change report given to Anderson Malta, RN (incoming nurse) by Nelly Rout, RN (outgoing nurse) on Brad Robles a 81 y.o. male who was admitted on 02/19/2015  2:45 PM. Report included the following information SBAR, Kardex, [place], Recent Results and Med Rec Status.         Significant changes during shift: none      Issues for physician to address:none           Code Status: Full Code     Infections: No current active infections     Allergies: Barium sulfate; Demerol [meperidine]; Novocain [procaine]; and Sulfa (sulfonamide antibiotics)     Current diet: DIET REGULAR             Opportunity for questions and clarifications were given to the incoming nurse. Patient's bed is in low position, side rails x2, door open PRN, call bell within reach and patient not in distress.      Nelly Rout, RN

## 2015-02-20 NOTE — Progress Notes (Addendum)
Patient earlier complaining of headache, stated Tylenol did not help him.  Offered Percocet, patient agreed to take, patient now resting quietly, stated the medication helped.

## 2015-02-20 NOTE — Progress Notes (Signed)
Oncology Nursing Communication Tool      7:50 AM  02/20/2015     Bedside shift change report given to Parke Simmers, RN (incoming nurse) by Galatia Moore, RN (outgoing nurse) on Brad Robles a 81 y.o. male who was admitted on 02/19/2015  2:45 PM. Report included the following information SBAR, Kardex, MAR, [place] and Recent Results.         Significant changes during shift: Headache 7/10, one time dose of aspirin      Issues for physician to address: Needs something for headaches            Code Status: Full Code     Infections: No current active infections     Allergies: Barium sulfate; Demerol [meperidine]; Novocain [procaine]; and Sulfa (sulfonamide antibiotics)     Current diet: DIET REGULAR       Pain Controlled  yes  no   Bowel Movement  yes  no   Last Bowel Movement (date)                 Vital Signs:   Patient Vitals for the past 12 hrs:   Temp Pulse Resp BP SpO2   02/20/15 0601 97.7 ??F (36.5 ??C) 71 18 128/84 97 %   02/19/15 2100 97.5 ??F (36.4 ??C) 85 20 126/77 96 %      Intake & Output:   No intake or output data in the 24 hours ending 02/20/15 0750   Laboratory Results:     Recent Results (from the past 12 hour(s))   CBC WITH AUTOMATED DIFF    Collection Time: 02/20/15  3:07 AM   Result Value Ref Range    WBC 4.4 4.1 - 11.1 K/uL    RBC 4.39 4.10 - 5.70 M/uL    HGB 13.5 12.1 - 17.0 g/dL    HCT 38.2 36.6 - 50.3 %    MCV 87.0 80.0 - 99.0 FL    MCH 30.8 26.0 - 34.0 PG    MCHC 35.3 30.0 - 36.5 g/dL    RDW 12.8 11.5 - 14.5 %    PLATELET 121 (L) 150 - 400 K/uL    NEUTROPHILS 69 32 - 75 %    LYMPHOCYTES 20 12 - 49 %    MONOCYTES 8 5 - 13 %    EOSINOPHILS 3 0 - 7 %    BASOPHILS 0 0 - 1 %    ABS. NEUTROPHILS 3.1 1.8 - 8.0 K/UL    ABS. LYMPHOCYTES 0.9 0.8 - 3.5 K/UL    ABS. MONOCYTES 0.4 0.0 - 1.0 K/UL    ABS. EOSINOPHILS 0.1 0.0 - 0.4 K/UL    ABS. BASOPHILS 0.0 0.0 - 0.1 K/UL   HEMOGLOBIN A1C WITH EAG    Collection Time: 02/20/15  3:07 AM   Result Value Ref Range    Hemoglobin A1c 5.1 4.2 - 6.3 %     Est. average glucose 100 mg/dL   LIPID PANEL    Collection Time: 02/20/15  3:07 AM   Result Value Ref Range    LIPID PROFILE          Cholesterol, total 159 <200 MG/DL    Triglyceride 97 <150 MG/DL    HDL Cholesterol 45 MG/DL    LDL, calculated 94.6 0 - 100 MG/DL    VLDL, calculated 19.4 MG/DL    CHOL/HDL Ratio 3.5 0 - 5.0     MAGNESIUM    Collection Time: 02/20/15  3:07 AM   Result Value Ref Range  Magnesium 2.2 1.6 - 2.4 mg/dL   METABOLIC PANEL, BASIC    Collection Time: 02/20/15  3:07 AM   Result Value Ref Range    Sodium 138 136 - 145 mmol/L    Potassium 4.0 3.5 - 5.1 mmol/L    Chloride 104 97 - 108 mmol/L    CO2 25 21 - 32 mmol/L    Anion gap 9 5 - 15 mmol/L    Glucose 104 (H) 65 - 100 mg/dL    BUN 9 6 - 20 MG/DL    Creatinine 0.67 (L) 0.70 - 1.30 MG/DL    BUN/Creatinine ratio 13 12 - 20      GFR est AA >60 >60 ml/min/1.23m    GFR est non-AA >60 >60 ml/min/1.779m   Calcium 8.5 8.5 - 10.1 MG/DL   TSH, 3RD GENERATION    Collection Time: 02/20/15  3:07 AM   Result Value Ref Range    TSH 3.86 (H) 0.36 - 3.74 uIU/mL              Opportunity for questions and clarifications were given to the incoming nurse. Patient's bed is in low position, side rails x2, door open PRN, call bell within reach and patient not in distress.      BrMercy MooreRN

## 2015-02-20 NOTE — Other (Signed)
Spoke with patients nurse Parke Simmers, Rn concerning biopsy order.  Per nurse patient states he did have breakfast this morning, small amount.  Pt needs to be NPO for eight hours for sedation.  Order placed for NPO after midnight tonight and will get biopsy tomorrow in the am.

## 2015-02-20 NOTE — Progress Notes (Signed)
Initial Nutrition Assessment:    INTERVENTIONS/RECOMMENDATIONS:   ?? Pureed diet  ?? Ensure TID    ASSESSMENT:   Mr. Verrette, 81 yo male present with LLL mass, COPD exacerbation, left pleural effusion, UTI, GERD. Pt reports of 10% wt loss in the past 2 months. Decreased intake due to altered taste as well as chewing and swallowing difficulties. Will change diet to pureed, pt request, and send ensure with meals.     Diet Order: Regular  % Eaten:  No data found.    Pertinent Medications: Reviewed Other (colace, folic acid, zofran PRN, PPI)  Pertinent Labs: Reviewed Other  Food Allergies: None Other  (pork)  Last BM: 1/10   Active     Hyperactive  Hypoactive        Absent BS  Skin:     Intact    Incision   Breakdown   Other:    Anthropometrics:   Height:   Weight: 63.9 kg (140 lb 14 oz)   IBW (%IBW):   ( ) UBW (%UBW):   (  %)   Last Weight Metrics:  Weight Loss Metrics 02/19/2015   Today's Wt 140 lb 14 oz       BMI: There is no height or weight on file to calculate BMI.    This BMI is indicative of:    Estimated Nutrition Needs (Based on):   1800 Kcals/day (MSJ: 1400 x 1.3) , 65 g (1 g/kg) Protein  Carbohydrate: At Least 130 g/day  Fluids: 1800 mL/day (23m/kcal)    NUTRITION DIAGNOSES:   Problem:  Inadequate protein-energy intake      Etiology: related to altered taste, chewing and swallowing difficulties     Signs/Symptoms: as evidenced by 10% wt loss in 2 months      NUTRITION INTERVENTIONS:  Meals/Snacks: General/healthful diet   Supplements: Commercial supplement              GOAL:   consume >50% of meals    LEARNING NEEDS (Diet, Food/Nutrient-Drug Interaction):     None Identified    Identified and Education Provided/Documented    Identified and Pt declined/was not appropriate     Cultureal, Religious, OR Ethnic Dietary Needs:     None Identified    Identified and Addressed      Interdisciplinary Care Plan Reviewed/Documented     Discharge Planning: modified texture to pt needs, 2-3 ONS per day       MONITORING /EVALUATION:      Food/Nutrient Intake Outcomes: Total energy intake, Liquid meal replacement  Physical Signs/Symptoms Outcomes: Weight/weight change, GI    NUTRITION RISK:     High               Moderate             Low    Minimal/Uncompromised    PT SEEN FOR:      MD Consult: Calorie Count      Diabetic Diet Education        Diet Education     Electrolyte Management     General Nutrition Management and Supplements     Management of Tube Feeding     TPN Recommendations      RN Referral:  MST score >=2     Enteral/Parenteral Nutrition PTA     Pregnant: Gestational DM or Multigestation     Pressure Ulcer/Wound Care needs          Low BMI    DTR Referral  Jerlyn Ly, RDN  Pager [phone]  Weekend Pager [phone]

## 2015-02-20 NOTE — Consults (Addendum)
[facility]  Pulmonary, Critical Care, and Sleep Medicine    Initial Patient Consult    Name: Brad Robles MRN: [id number]   DOB: 10-07-1934 Hospital: [facility]   Date: 02/20/2015        IMPRESSION:   ?? Lung mass  ?? Weight loss  ?? Hemoptysis??      RECOMMENDATIONS:   ?? No need for O2  ?? Needs ct guided bx  ?? If bx + for cancer then will need pet scan for staging  ?? Changed to po abx     Subjective:     This patient has been seen and evaluated at the request of Dr. Marolyn Haller for lung mass. Patient is a 81 y.o. male ex smoker presents with weight loss, cough and questionable hemoptysis  Found to have a lung mass on chest ct  Patient admitted on IV abx  Today in no distress  Only c/o HA      Past Medical History   Diagnosis Date   ??? Seizures ([place])       Past Surgical History   Procedure Laterality Date   ??? Hx colonoscopy  2011   ??? Hx orthopaedic       neck surgery for pinched nerves      Prior to Admission medications    Medication Sig Start Date End Date Taking? Authorizing Provider   ASPIRIN/SALICYLAMIDE/CAFFEINE (BC HEADACHE POWDER PO) Take  by mouth.   Yes Phys Other, MD   cyclobenzaprine (FLEXERIL) 10 mg tablet Take 10 mg by mouth three (3) times daily as needed for Muscle Spasm(s).   Yes Phys Other, MD     Allergies   Allergen Reactions   ??? Barium Sulfate Other (comments)     Headaches     ??? Demerol [Meperidine] Other (comments)     "jerks" Slip into "black hole"     ??? Novocain [Procaine] Nausea and Vomiting   ??? Sulfa (Sulfonamide Antibiotics) Unknown (comments)     Reports as child he was told and can't remember        Social History   Substance Use Topics   ??? Smoking status: Never Smoker   ??? Smokeless tobacco: Not on file   ??? Alcohol use Yes      Comment: occasionally      History reviewed. No pertinent family history.     Current Facility-Administered Medications   Medication Dose Route Frequency   ??? levoFLOXacin (LEVAQUIN) tablet 750 mg  750 mg Oral Q24H    ??? sodium chloride (NS) flush 5-10 mL  5-10 mL IntraVENous Q8H   ??? docusate sodium (COLACE) capsule 100 mg  100 mg Oral BID   ??? enoxaparin (LOVENOX) injection 30 mg  30 mg SubCUTAneous Q24H   ??? pantoprazole (PROTONIX) tablet 40 mg  40 mg Oral ACB       Review of Systems:  A comprehensive review of systems was negative except for: Respiratory: positive for cough or hemoptysis  Neurological: positive for headaches    Objective:   Vital Signs:    Visit Vitals   ??? BP 128/84 (BP 1 Location: Right arm, BP Patient Position: At rest)   ??? Pulse 71   ??? Temp 97.7 ??F (36.5 ??C)   ??? Resp 18   ??? Wt 63.9 kg (140 lb 14 oz)   ??? SpO2 97%       O2 Device: Room air       Temp (24hrs), Avg:97.8 ??F (36.6 ??C), Min:97.5 ??  F (36.4 ??C), Max:98.1 ??F (36.7 ??C)       Intake/Output:   Last shift:         Last 3 shifts:    No intake or output data in the 24 hours ending 02/20/15 0848   Physical Exam:   General:  Alert, cooperative, no distress, appears stated age.   Head:  Normocephalic, without obvious abnormality, atraumatic.   Eyes:  Conjunctivae/corneas clear. PERRL, EOMs intact.   Nose: Nares normal. Septum midline. Mucosa normal. No drainage or sinus tenderness.   Throat: Lips, mucosa, and tongue normal. Teeth and gums normal.   Neck: Supple, symmetrical, trachea midline, no adenopathy, thyroid: no enlargment/tenderness/nodules, no carotid bruit and no JVD.   Back:   Symmetric, no curvature. ROM normal.   Lungs:   Clear to auscultation bilaterally.   Chest wall:  No tenderness or deformity.   Heart:  Regular rate and rhythm, S1, S2 normal, no murmur, click, rub or gallop.   Abdomen:   Soft, non-tender. Bowel sounds normal. No masses,  No organomegaly.   Extremities: Extremities normal, atraumatic, no cyanosis or edema.   Pulses: 2+ and symmetric all extremities.   Skin: Skin color, texture, turgor normal. No rashes or lesions   Lymph nodes: Cervical, supraclavicular, and axillary nodes normal.   Neurologic: Grossly nonfocal     Data review:      Recent Results (from the past 24 hour(s))   EKG, 12 LEAD, INITIAL    Collection Time: 02/19/15  2:48 PM   Result Value Ref Range    Ventricular Rate 92 BPM    Atrial Rate 92 BPM    P-R Interval 156 ms    QRS Duration 130 ms    Q-T Interval 386 ms    QTC Calculation (Bezet) 477 ms    Calculated P Axis 67 degrees    Calculated R Axis 175 degrees    Calculated T Axis 39 degrees    Diagnosis       Normal sinus rhythm  Right bundle branch block  Left posterior fascicular block  ** Bifascicular block **  Confirmed by Berta Minor [phone]) on 02/19/2015 9:39:39 PM     CBC WITH AUTOMATED DIFF    Collection Time: 02/19/15  3:25 PM   Result Value Ref Range    WBC 6.3 4.1 - 11.1 K/uL    RBC 4.74 4.10 - 5.70 M/uL    HGB 14.7 12.1 - 17.0 g/dL    HCT 41.6 36.6 - 50.3 %    MCV 87.8 80.0 - 99.0 FL    MCH 31.0 26.0 - 34.0 PG    MCHC 35.3 30.0 - 36.5 g/dL    RDW 12.7 11.5 - 14.5 %    PLATELET 132 (L) 150 - 400 K/uL    NEUTROPHILS 84 (H) 32 - 75 %    LYMPHOCYTES 10 (L) 12 - 49 %    MONOCYTES 5 5 - 13 %    EOSINOPHILS 1 0 - 7 %    BASOPHILS 0 0 - 1 %    ABS. NEUTROPHILS 5.3 1.8 - 8.0 K/UL    ABS. LYMPHOCYTES 0.6 (L) 0.8 - 3.5 K/UL    ABS. MONOCYTES 0.3 0.0 - 1.0 K/UL    ABS. EOSINOPHILS 0.1 0.0 - 0.4 K/UL    ABS. BASOPHILS 0.0 0.0 - 0.1 K/UL    RBC COMMENTS NORMOCYTIC, NORMOCHROMIC      DF SMEAR SCANNED     PROTHROMBIN TIME + INR    Collection  Time: 02/19/15  3:25 PM   Result Value Ref Range    INR 1.1 0.9 - 1.1      Prothrombin time 11.5 (H) 9.0 - 09.8 sec   METABOLIC PANEL, COMPREHENSIVE    Collection Time: 02/19/15  3:25 PM   Result Value Ref Range    Sodium 137 136 - 145 mmol/L    Potassium 4.2 3.5 - 5.1 mmol/L    Chloride 104 97 - 108 mmol/L    CO2 27 21 - 32 mmol/L    Anion gap 6 5 - 15 mmol/L    Glucose 98 65 - 100 mg/dL    BUN 13 6 - 20 MG/DL    Creatinine 0.75 0.70 - 1.30 MG/DL    BUN/Creatinine ratio 17 12 - 20      GFR est AA >60 >60 ml/min/1.15m    GFR est non-AA >60 >60 ml/min/1.797m   Calcium 8.5 8.5 - 10.1 MG/DL     Bilirubin, total 0.5 0.2 - 1.0 MG/DL    ALT 14 12 - 78 U/L    AST 18 15 - 37 U/L    Alk. phosphatase 89 45 - 117 U/L    Protein, total 6.8 6.4 - 8.2 g/dL    Albumin 3.2 (L) 3.5 - 5.0 g/dL    Globulin 3.6 2.0 - 4.0 g/dL    A-G Ratio 0.9 (L) 1.1 - 2.2     LIPASE    Collection Time: 02/19/15  3:25 PM   Result Value Ref Range    Lipase 146 73 - 393 U/L   D DIMER    Collection Time: 02/19/15  3:25 PM   Result Value Ref Range    D-dimer 9.84 (H) 0.00 - 0.65 mg/L FEU   CK W/ REFLX CKMB    Collection Time: 02/19/15  3:25 PM   Result Value Ref Range    CK 41 39 - 308 U/L   TROPONIN I    Collection Time: 02/19/15  3:25 PM   Result Value Ref Range    Troponin-I, Qt. 0.04 <0.05 ng/mL   POC FECAL OCCULT BLOOD    Collection Time: 02/19/15  4:13 PM   Result Value Ref Range    Occult blood, stool (POC) NEGATIVE  NEG     EKG, 12 LEAD, SUBSEQUENT    Collection Time: 02/19/15  4:19 PM   Result Value Ref Range    Ventricular Rate 80 BPM    Atrial Rate 80 BPM    P-R Interval 158 ms    QRS Duration 128 ms    Q-T Interval 414 ms    QTC Calculation (Bezet) 477 ms    Calculated P Axis 61 degrees    Calculated R Axis 160 degrees    Calculated T Axis 52 degrees    Diagnosis       Sinus rhythm with premature atrial complexes  Right bundle branch block  Left posterior fascicular block  ** Bifascicular block **  Cannot rule out Inferior infarct , age undetermined  Abnormal ECG  When compared with ECG of 19-Feb-2015 14:48,  MANUAL COMPARISON REQUIRED, DATA IS UNCONFIRMED     URINALYSIS W/MICROSCOPIC    Collection Time: 02/19/15  5:01 PM   Result Value Ref Range    Color YELLOW/STRAW      Appearance CLOUDY (A) CLEAR      Specific gravity 1.016 1.003 - 1.030      pH (UA) 6.0 5.0 - 8.0      Protein NEGATIVE  NEG mg/dL    Glucose NEGATIVE  NEG mg/dL    Ketone NEGATIVE  NEG mg/dL    Bilirubin NEGATIVE  NEG      Blood NEGATIVE  NEG      Urobilinogen 1.0 0.2 - 1.0 EU/dL    Nitrites NEGATIVE  NEG      Leukocyte Esterase SMALL (A) NEG       WBC 5-10 0 - 4 /hpf    RBC 0-5 0 - 5 /hpf    Epithelial cells FEW FEW /lpf    Bacteria NEGATIVE  NEG /hpf   TROPONIN I    Collection Time: 02/19/15  6:35 PM   Result Value Ref Range    Troponin-I, Qt. 0.10 (H) <0.05 ng/mL   CBC WITH AUTOMATED DIFF    Collection Time: 02/20/15  3:07 AM   Result Value Ref Range    WBC 4.4 4.1 - 11.1 K/uL    RBC 4.39 4.10 - 5.70 M/uL    HGB 13.5 12.1 - 17.0 g/dL    HCT 38.2 36.6 - 50.3 %    MCV 87.0 80.0 - 99.0 FL    MCH 30.8 26.0 - 34.0 PG    MCHC 35.3 30.0 - 36.5 g/dL    RDW 12.8 11.5 - 14.5 %    PLATELET 121 (L) 150 - 400 K/uL    NEUTROPHILS 69 32 - 75 %    LYMPHOCYTES 20 12 - 49 %    MONOCYTES 8 5 - 13 %    EOSINOPHILS 3 0 - 7 %    BASOPHILS 0 0 - 1 %    ABS. NEUTROPHILS 3.1 1.8 - 8.0 K/UL    ABS. LYMPHOCYTES 0.9 0.8 - 3.5 K/UL    ABS. MONOCYTES 0.4 0.0 - 1.0 K/UL    ABS. EOSINOPHILS 0.1 0.0 - 0.4 K/UL    ABS. BASOPHILS 0.0 0.0 - 0.1 K/UL   HEMOGLOBIN A1C WITH EAG    Collection Time: 02/20/15  3:07 AM   Result Value Ref Range    Hemoglobin A1c 5.1 4.2 - 6.3 %    Est. average glucose 100 mg/dL   LIPID PANEL    Collection Time: 02/20/15  3:07 AM   Result Value Ref Range    LIPID PROFILE          Cholesterol, total 159 <200 MG/DL    Triglyceride 97 <150 MG/DL    HDL Cholesterol 45 MG/DL    LDL, calculated 94.6 0 - 100 MG/DL    VLDL, calculated 19.4 MG/DL    CHOL/HDL Ratio 3.5 0 - 5.0     MAGNESIUM    Collection Time: 02/20/15  3:07 AM   Result Value Ref Range    Magnesium 2.2 1.6 - 2.4 mg/dL   METABOLIC PANEL, BASIC    Collection Time: 02/20/15  3:07 AM   Result Value Ref Range    Sodium 138 136 - 145 mmol/L    Potassium 4.0 3.5 - 5.1 mmol/L    Chloride 104 97 - 108 mmol/L    CO2 25 21 - 32 mmol/L    Anion gap 9 5 - 15 mmol/L    Glucose 104 (H) 65 - 100 mg/dL    BUN 9 6 - 20 MG/DL    Creatinine 0.67 (L) 0.70 - 1.30 MG/DL    BUN/Creatinine ratio 13 12 - 20      GFR est AA >60 >60 ml/min/1.34m    GFR est non-AA >60 >60 ml/min/1.750m   Calcium 8.5  8.5 - 10.1 MG/DL    TSH, 3RD GENERATION    Collection Time: 02/20/15  3:07 AM   Result Value Ref Range    TSH 3.86 (H) 0.36 - 3.74 uIU/mL       Imaging:  I have personally reviewed the patient???s radiographs and have reviewed the reports:  Chest ct reviewed        Frazier Butt, MD

## 2015-02-20 NOTE — Progress Notes (Signed)
SBAR, MAR, KARDEX report given to Donata Clay, Therapist, sports.

## 2015-02-21 ENCOUNTER — Observation Stay: Admit: 2015-02-21 | Payer: MEDICARE | Primary: Family Medicine

## 2015-02-21 ENCOUNTER — Inpatient Hospital Stay: Admit: 2015-02-21 | Payer: MEDICARE | Primary: Family Medicine

## 2015-02-21 LAB — MAGNESIUM: Magnesium: 2.3 mg/dL (ref 1.6–2.4)

## 2015-02-21 LAB — CULTURE, URINE
Colony Count: <1000
Culture result:: NO GROWTH

## 2015-02-21 LAB — CBC WITH AUTOMATED DIFF
ABS. BASOPHILS: 0 10*3/uL (ref 0.0–0.1)
ABS. EOSINOPHILS: 0.2 10*3/uL (ref 0.0–0.4)
ABS. LYMPHOCYTES: 1 10*3/uL (ref 0.8–3.5)
ABS. MONOCYTES: 0.4 10*3/uL (ref 0.0–1.0)
ABS. NEUTROPHILS: 2.5 10*3/uL (ref 1.8–8.0)
BASOPHILS: 1 % (ref 0–1)
EOSINOPHILS: 4 % (ref 0–7)
HCT: 40.9 % (ref 36.6–50.3)
HGB: 14 g/dL (ref 12.1–17.0)
LYMPHOCYTES: 24 % (ref 12–49)
MCH: 29.9 PG (ref 26.0–34.0)
MCHC: 34.2 g/dL (ref 30.0–36.5)
MCV: 87.4 FL (ref 80.0–99.0)
MONOCYTES: 10 % (ref 5–13)
NEUTROPHILS: 61 % (ref 32–75)
PLATELET: 133 10*3/uL — ABNORMAL LOW (ref 150–400)
RBC: 4.68 M/uL (ref 4.10–5.70)
RDW: 12.8 % (ref 11.5–14.5)
WBC: 4.1 10*3/uL (ref 4.1–11.1)

## 2015-02-21 LAB — METABOLIC PANEL, COMPREHENSIVE
A-G Ratio: 0.9 — ABNORMAL LOW (ref 1.1–2.2)
ALT (SGPT): 13 U/L (ref 12–78)
AST (SGOT): 14 U/L — ABNORMAL LOW (ref 15–37)
Albumin: 3 g/dL — ABNORMAL LOW (ref 3.5–5.0)
Alk. phosphatase: 78 U/L (ref 45–117)
Anion gap: 9 mmol/L (ref 5–15)
BUN/Creatinine ratio: 8 — ABNORMAL LOW (ref 12–20)
BUN: 6 MG/DL (ref 6–20)
Bilirubin, total: 0.5 MG/DL (ref 0.2–1.0)
CO2: 26 mmol/L (ref 21–32)
Calcium: 8.8 MG/DL (ref 8.5–10.1)
Chloride: 104 mmol/L (ref 97–108)
Creatinine: 0.73 MG/DL (ref 0.70–1.30)
GFR est AA: >60 mL/min/{1.73_m2} (ref >60)
GFR est non-AA: >60 mL/min/{1.73_m2} (ref >60)
Globulin: 3.5 g/dL (ref 2.0–4.0)
Glucose: 87 mg/dL (ref 65–100)
Potassium: 4.2 mmol/L (ref 3.5–5.1)
Protein, total: 6.5 g/dL (ref 6.4–8.2)
Sodium: 139 mmol/L (ref 136–145)

## 2015-02-21 MED ORDER — PROMETHAZINE 25 MG TAB
25 mg | ORAL_TABLET | Freq: Four times a day (QID) | ORAL | 0 refills | Status: AC | PRN
Start: 2015-02-21 — End: ?

## 2015-02-21 MED ORDER — MIDAZOLAM 1 MG/ML IJ SOLN
1 mg/mL | INTRAMUSCULAR | Status: DC | PRN
Start: 2015-02-21 — End: 2015-02-21
  Administered 2015-02-21: 13:00:00 via INTRAVENOUS

## 2015-02-21 MED ORDER — IPRATROPIUM-ALBUTEROL 2.5 MG-0.5 MG/3 ML NEB SOLUTION
2.5 mg-0.5 mg/3 ml | Freq: Four times a day (QID) | RESPIRATORY_TRACT | Status: DC | PRN
Start: 2015-02-21 — End: 2015-02-21

## 2015-02-21 MED ORDER — IPRATROPIUM-ALBUTEROL 2.5 MG-0.5 MG/3 ML NEB SOLUTION
2.5 mg-0.5 mg/3 ml | Freq: Three times a day (TID) | RESPIRATORY_TRACT | Status: DC
Start: 2015-02-21 — End: 2015-02-21
  Administered 2015-02-21: 12:00:00 via RESPIRATORY_TRACT

## 2015-02-21 MED ORDER — SODIUM CHLORIDE 0.9 % IV
Freq: Once | INTRAVENOUS | Status: AC
Start: 2015-02-21 — End: 2015-02-21
  Administered 2015-02-21: 13:00:00 via INTRAVENOUS

## 2015-02-21 MED ORDER — OXYCODONE-ACETAMINOPHEN 5 MG-325 MG TAB
5-325 mg | ORAL_TABLET | ORAL | 0 refills | Status: AC | PRN
Start: 2015-02-21 — End: ?

## 2015-02-21 MED ORDER — SODIUM BICARBONATE 4 % IV
4 % | Freq: Once | INTRAVENOUS | Status: AC
Start: 2015-02-21 — End: 2015-02-21
  Administered 2015-02-21: 14:00:00 via SUBCUTANEOUS

## 2015-02-21 MED FILL — SODIUM CHLORIDE 0.9 % IV: INTRAVENOUS | Qty: 1000

## 2015-02-21 MED FILL — OXYCODONE-ACETAMINOPHEN 5 MG-325 MG TAB: 5-325 mg | ORAL | Qty: 2

## 2015-02-21 MED FILL — CODEINE-GUAIFENESIN 10 MG-100 MG/5 ML ORAL LIQUID: 100-10 mg/5 mL | ORAL | Qty: 10

## 2015-02-21 MED FILL — FOLIC ACID 1 MG TAB: 1 mg | ORAL | Qty: 1

## 2015-02-21 MED FILL — CYCLOBENZAPRINE 10 MG TAB: 10 mg | ORAL | Qty: 1

## 2015-02-21 MED FILL — BD POSIFLUSH NORMAL SALINE 0.9 % INJECTION SYRINGE: INTRAMUSCULAR | Qty: 10

## 2015-02-21 MED FILL — ONDANSETRON (PF) 4 MG/2 ML INJECTION: 4 mg/2 mL | INTRAMUSCULAR | Qty: 2

## 2015-02-21 MED FILL — MIDAZOLAM 1 MG/ML IJ SOLN: 1 mg/mL | INTRAMUSCULAR | Qty: 5

## 2015-02-21 MED FILL — IPRATROPIUM-ALBUTEROL 2.5 MG-0.5 MG/3 ML NEB SOLUTION: 2.5 mg-0.5 mg/3 ml | RESPIRATORY_TRACT | Qty: 3

## 2015-02-21 MED FILL — PROTONIX 40 MG TABLET,DELAYED RELEASE: 40 mg | ORAL | Qty: 1

## 2015-02-21 MED FILL — DOK 100 MG CAPSULE: 100 mg | ORAL | Qty: 1

## 2015-02-21 NOTE — Other (Addendum)
Name of procedure:  Left lung mass biopsy      Complications, if any, r/t procedure:  None      Sedation medications given:  Versed 1 mg IV      Sedation tolerated: Well       VS : Stable       Post Procedure Care order sets in connectcare:  Yes      Any other specific needs to pt. Care:  PCXR at 11 am (2 hours post procedure)    Report to patient's nurse.

## 2015-02-21 NOTE — Progress Notes (Signed)
Mr. Beneke went home with his daughter by his side. He was educated and demonstrated today how to use an inhaler that was ordered for him.  Discharge teaching and medication education was provided and verbalized as understood by both the patient and adult daughter.

## 2015-02-21 NOTE — Other (Signed)
Received patient in xray recovery.  For left lung biopsy.  Evaluated by Dr. Karoline Caldwell.

## 2015-02-21 NOTE — Discharge Summary (Addendum)
Hospitalist Discharge Summary     Patient ID:  Brad Robles  [id number]  81 y.o.  July 21, 1934    PCP on record: None    Admit date: 02/19/2015  Discharge date and time: 02/21/2015      DISCHARGE DIAGNOSIS:    Left lower lobe mass and multiple pulmonary nodules POA- s/p IR guided Lung nodule biopsy - follow up with Pulmonary Dr Brad Robles as outpatient for results & further management  Acute COPD Exacerbation POA- resolved now, not hypoxic, OP follow up with Pulm  Probable Urinary tract infection POA- ruled out with neg culture,s/p levaquin here  GERD    CONSULTATIONS:  IP CONSULT TO PULMONOLOGY    Excerpted HPI from H&P of Brad C Rodd, MD:  "81 year old Caucasian   male with no significant past medical history, who was presented to the   emergency department with a 2-week history of midsternal   nonradiating chest pain. The patient states his symptoms are associated   with nonproductive cough, shortness of breath and hemoptysis   for the past 2 weeks. The patient reports having progressively   worsening weight loss for 1 year, and has lost 15 pounds for the last 2 months.   The patient denies palpitations, dyspnea on exertion, orthopnea or   paroxysmal nocturnal dyspnea, diaphoresis, dizziness, syncope, fever,   chills, nausea, vomiting, diarrhea, wheezing, blurry vision, headaches,   frequency, myalgias or weakness."    ______________________________________________________________________  DISCHARGE SUMMARY/HOSPITAL COURSE:  for full details see H&P, daily progress notes, labs, consult notes.     Left lower lobe mass and multiple pulmonary nodules. Needs CT guided Biopsy, planned for tomorrow. Most likely malignant, Pulmonology help appreciated.  COPD Exacerbation: ex smoker, emphysematous changes noted in cT, c/w LVQ, bronchodilators, add Incruse  Left pleural effusion. Very minimal not amenable for drainage.  Urinary tract infection. No clear cut in Dx, c/w LVQ, F/U Cultures.   Thrombocytopenia. Start folic acid.  Dementia? Paranoic Ideation?: non disruptive, will monitor for now.  GERD: patient been taking ASA compounds for pain, start PPI.  Surrogate: DTR Sandy [phone]  Code status: Full        _______________________________________________________________________  Patient seen and examined by me on discharge day.  Pertinent Findings:  Gen:    Not in distress  Chest: Clear lungs, no wheezing today  CVS:   Regular rhythm.  No edema  Abd:  Soft, not distended, not tender  Neuro:  Alert, oriented x 3,  _______________________________________________________________________  DISCHARGE MEDICATIONS:   Current Discharge Medication List      START taking these medications    Details   oxyCODONE-acetaminophen (PERCOCET) 5-325 mg per tablet Take 1 Tab by mouth every four (4) hours as needed. Max Daily Amount: 6 Tabs.  Qty: 20 Tab, Refills: 0      promethazine (PHENERGAN) 25 mg tablet Take 1 Tab by mouth every six (6) hours as needed for Nausea.  Qty: 30 Tab, Refills: 0         CONTINUE these medications which have NOT CHANGED    Details   cyclobenzaprine (FLEXERIL) 10 mg tablet Take 10 mg by mouth three (3) times daily as needed for Muscle Spasm(s).         STOP taking these medications       ASPIRIN/SALICYLAMIDE/CAFFEINE (BC HEADACHE POWDER PO) Comments:   Reason for Stopping:               My Recommended Diet, Activity, Wound Care, and follow-up labs are listed in  the patient's Discharge Insturctions which I have personally completed and reviewed.    _______________________________________________________________________  DISPOSITION:    Home with Family: x   Home with HH/PT/OT/RN:    SNF/LTC:    SAHR:    OTHER:        Condition at Discharge:  Stable  _______________________________________________________________________  Follow up with:   PCP : None  Follow-up Information     Follow up With Details Comments Contact Info    None   None ([phone]) Patient stated that they have no PCP                 Total time in minutes spent coordinating this discharge (includes going over instructions, follow-up, prescriptions, and preparing report for sign off to her PCP) :  25 minutes    Signed:  Alain Marion, MD

## 2015-02-21 NOTE — H&P (Signed)
Interventional Radiology History and Physical (Inpatient)    Patient: Brad Robles 81 y.o. male     Referring Physician:  No ref. provider found    Chief Complaint: Chest Pain (x November Relieved by [facility] Powder Reports some SOB able to speak in full sentences ) and Other (States "I've been exposed to that tree killing stuff." States exposure occurred July 2015)      History of Present Illness: moderate sedation for lung nodule biopsy    History:  Past Medical History   Diagnosis Date   ??? Seizures ([place])      History reviewed. No pertinent family history.  Social History     Social History   ??? Marital status: DIVORCED     Spouse name: N/A   ??? Number of children: N/A   ??? Years of education: N/A     Occupational History   ??? Not on file.     Social History Main Topics   ??? Smoking status: Never Smoker   ??? Smokeless tobacco: Not on file   ??? Alcohol use Yes      Comment: occasionally   ??? Drug use: No   ??? Sexual activity: Not on file     Other Topics Concern   ??? Not on file     Social History Narrative   ??? No narrative on file       Allergies:   Allergies   Allergen Reactions   ??? Barium Sulfate Other (comments)     Headaches     ??? Demerol [Meperidine] Other (comments)     "jerks" Slip into "black hole"     ??? Novocain [Procaine] Nausea and Vomiting   ??? Sulfa (Sulfonamide Antibiotics) Unknown (comments)     Reports as child he was told and can't remember         Current Medications:  Current Facility-Administered Medications   Medication Dose Route Frequency   ??? albuterol-ipratropium (DUO-NEB) 2.5 MG-0.5 MG/3 ML  3 mL Nebulization Q8H RT   ??? albuterol-ipratropium (DUO-NEB) 2.5 MG-0.5 MG/3 ML  3 mL Nebulization Q6H PRN   ??? midazolam (VERSED) injection 5 mg  5 mg IntraVENous Multiple   ??? 0.9% sodium chloride infusion  25 mL/hr IntraVENous ONCE   ??? sodium bicarbonate (NEUT) injection 2 mL  2 mL SubCUTAneous RAD ONCE   ??? levoFLOXacin (LEVAQUIN) tablet 750 mg  750 mg Oral ACD    ??? oxyCODONE-acetaminophen (PERCOCET) 5-325 mg per tablet 2 Tab  2 Tab Oral Q4H PRN   ??? guaiFENesin-codeine (ROBITUSSIN AC) 100-10 mg/5 mL solution 10 mL  10 mL Oral TID   ??? butalbital-acetaminophen-caffeine (FIORICET, ESGIC) 50-325-40 mg per tablet 2 Tab  2 Tab Oral Q8H PRN   ??? acetaminophen (TYLENOL) tablet 650 mg  650 mg Oral Q4H PRN   ??? umeclidinium (INCRUSE ELLIPTA) 62.5 mcg/actuation  1 Puff Inhalation DAILY   ??? folic acid (FOLVITE) tablet 1 mg  1 mg Oral DAILY   ??? cyclobenzaprine (FLEXERIL) tablet 10 mg  10 mg Oral TID PRN   ??? sodium chloride (NS) flush 5-10 mL  5-10 mL IntraVENous Q8H   ??? sodium chloride (NS) flush 5-10 mL  5-10 mL IntraVENous PRN   ??? ondansetron (ZOFRAN) injection 4 mg  4 mg IntraVENous Q4H PRN   ??? docusate sodium (COLACE) capsule 100 mg  100 mg Oral BID   ??? enoxaparin (LOVENOX) injection 30 mg  30 mg SubCUTAneous Q24H   ??? pantoprazole (PROTONIX) tablet 40 mg  40 mg Oral  ACB        Physical Exam:  Blood pressure 125/76, pulse 65, temperature 97.6 ??F (36.4 ??C), resp. rate 20, height '5\' 10"'$  (1.778 Ottavio Norem), weight 63.9 kg (140 lb 14 oz), SpO2 92 %.  GENERAL: alert, cooperative, no distress, appears older than stated age, LUNG: clear to auscultation bilaterally, HEART: regular rate and rhythm, distant sounds    Findings/Diagnosis: moderate sedation for lung nodule biopsy    Alerts:    Hospital Problems  Date Reviewed: March 17, 2015          Codes Class Noted POA    Hemoptysis ICD-10-CM: R04.2  ICD-9-CM: 786.30  03-17-15 Yes        Lung cancer ([place]) ICD-10-CM: C34.90  ICD-9-CM: 162.9  02/19/2015 Yes              Laboratory:      Recent Labs      Mar 17, 2015   0259   02/19/15   1525   HGB  14.0   < >  14.7   HCT  40.9   < >  41.6   WBC  4.1   < >  6.3   PLT  133*   < >  132*   INR   --    --   1.1   BUN  6   < >  13   CREA  0.73   < >  0.75   K  4.2   < >  4.2    < > = values in this interval not displayed.         Plan of Care/Planned Procedure:   Risks, benefits, and alternatives reviewed with patient and he agrees to proceed with the procedure.       Full dictated report to follow.

## 2015-02-21 NOTE — Progress Notes (Signed)
Oncology Nursing Communication Tool      8:24 AM  02/21/2015     Bedside and Verbal shift change report given to [place] and [place], Therapist, sports (incoming nurse) by Geraldine Solar, RN (outgoing nurse) on Brad Robles a 81 y.o. male who was admitted on 02/19/2015  2:45 PM. Report included the following information SBAR, Kardex, ED Summary, Procedure Summary, Intake/Output, MAR, Accordion, Recent Results and Med Rec Status.         Significant changes during shift: currently npo for lung biopsy      Issues for physician to address: none            Code Status: Full Code     Infections: No current active infections     Allergies: Barium sulfate; Demerol [meperidine]; Novocain [procaine]; and Sulfa (sulfonamide antibiotics)     Current diet: DIET NPO  DIET NUTRITIONAL SUPPLEMENTS Breakfast, Lunch, Dinner; Ensure Complete       Pain Controlled  yes  no   Bowel Movement  yes  no   Last Bowel Movement (date)                 Vital Signs:   Patient Vitals for the past 12 hrs:   Temp Pulse Resp BP SpO2   02/21/15 0800 97.6 ??F (36.4 ??C) 65 20 125/76 92 %   02/21/15 0719 - - - - 94 %   02/21/15 0549 97.7 ??F (36.5 ??C) 80 18 120/79 96 %   02/20/15 2242 97.6 ??F (36.4 ??C) 76 18 153/89 95 %      Intake & Output:     Intake/Output Summary (Last 24 hours) at 02/21/15 0824  Last data filed at 02/20/15 1941   Gross per 24 hour   Intake                0 ml   Output                1 ml   Net               -1 ml      Laboratory Results:     Recent Results (from the past 12 hour(s))   CBC WITH AUTOMATED DIFF    Collection Time: 02/21/15  2:59 AM   Result Value Ref Range    WBC 4.1 4.1 - 11.1 K/uL    RBC 4.68 4.10 - 5.70 M/uL    HGB 14.0 12.1 - 17.0 g/dL    HCT 40.9 36.6 - 50.3 %    MCV 87.4 80.0 - 99.0 FL    MCH 29.9 26.0 - 34.0 PG    MCHC 34.2 30.0 - 36.5 g/dL    RDW 12.8 11.5 - 14.5 %    PLATELET 133 (L) 150 - 400 K/uL    NEUTROPHILS 61 32 - 75 %    LYMPHOCYTES 24 12 - 49 %    MONOCYTES 10 5 - 13 %    EOSINOPHILS 4 0 - 7 %     BASOPHILS 1 0 - 1 %    ABS. NEUTROPHILS 2.5 1.8 - 8.0 K/UL    ABS. LYMPHOCYTES 1.0 0.8 - 3.5 K/UL    ABS. MONOCYTES 0.4 0.0 - 1.0 K/UL    ABS. EOSINOPHILS 0.2 0.0 - 0.4 K/UL    ABS. BASOPHILS 0.0 0.0 - 0.1 K/UL   MAGNESIUM    Collection Time: 02/21/15  2:59 AM   Result Value Ref Range    Magnesium 2.3 1.6 -  2.4 mg/dL   METABOLIC PANEL, COMPREHENSIVE    Collection Time: 02/21/15  2:59 AM   Result Value Ref Range    Sodium 139 136 - 145 mmol/L    Potassium 4.2 3.5 - 5.1 mmol/L    Chloride 104 97 - 108 mmol/L    CO2 26 21 - 32 mmol/L    Anion gap 9 5 - 15 mmol/L    Glucose 87 65 - 100 mg/dL    BUN 6 6 - 20 MG/DL    Creatinine 0.73 0.70 - 1.30 MG/DL    BUN/Creatinine ratio 8 (L) 12 - 20      GFR est AA >60 >60 ml/min/1.46m    GFR est non-AA >60 >60 ml/min/1.734m   Calcium 8.8 8.5 - 10.1 MG/DL    Bilirubin, total 0.5 0.2 - 1.0 MG/DL    ALT 13 12 - 78 U/L    AST 14 (L) 15 - 37 U/L    Alk. phosphatase 78 45 - 117 U/L    Protein, total 6.5 6.4 - 8.2 g/dL    Albumin 3.0 (L) 3.5 - 5.0 g/dL    Globulin 3.5 2.0 - 4.0 g/dL    A-G Ratio 0.9 (L) 1.1 - 2.2                Opportunity for questions and clarifications were given to the incoming nurse. Patient's bed is in low position, side rails x2, door open PRN, call bell within reach and patient not in distress.      JeGeraldine SolarRN

## 2015-02-21 NOTE — Progress Notes (Signed)
Pt. here for lung bx.  He was admitted as inpt. At first, but changed to Observation today.  I have the signed copy of the Condition 44 and the Observ. Sheet on the chart.  Gave pt. Copies of these also. He is for discharge today.        Pt. Usually resides in [place]. And [place]. He was living alone.  His dtr. Lives here and he is staying with her for now.  She was asking questions about help at home , if he needs that later.  He just had the lung bx. today, so he doesn't have a definite dx. yet.   I explained that he would have to transfer his [place] Medicaid to [place] Medicaid to get home aides.  I explained that Medicare doesn't cover this.  Pt. Has [place] Medicaid and [place] Medicare.(?) Pt. will be discharged later today to his dtr's. Home.--- Gaylyn Rong, [phone]    Care Management Interventions  PCP Verified by CM:  (none- pt. lives in [place])  Mode of Transport at Discharge: Other (see comment) (Dtr. )  Transition of Care Consult (CM Consult): Discharge Planning  MyChart Signup: No  Discharge Durable Medical Equipment: No  Health Maintenance Reviewed: Yes  Physical Therapy Consult: No  Occupational Therapy Consult: No  Speech Therapy Consult: No  Current Support Network: Lives Alone, Own Home, Other (currently staying with his dtr.)  Confirm Follow Up Transport: Family  Plan discussed with Pt/Family/Caregiver: Yes  Discharge Location  Discharge Placement: Home with family assistance

## 2015-02-21 NOTE — Progress Notes (Signed)
For ct guided ling biopsy today  Will need pet scan for staging if biopsy + for cancer  Can go home after biopsy??  Frazier Butt, MD

## 2015-02-21 NOTE — Progress Notes (Signed)
Returns to his room with transport.  Radiology notified patient needs 2 hour post biopsy PCXR at 1100.

## 2015-03-03 ENCOUNTER — Encounter

## 2015-03-06 ENCOUNTER — Inpatient Hospital Stay: Admit: 2015-03-06 | Payer: MEDICARE | Attending: Pulmonary Disease | Primary: Family Medicine

## 2015-03-06 DIAGNOSIS — C3492 Malignant neoplasm of unspecified part of left bronchus or lung: Secondary | ICD-10-CM

## 2015-03-06 MED ORDER — F-18 FLUORODEOXYGLUCOSE
Freq: Once | Status: AC
Start: 2015-03-06 — End: 2015-03-06
  Administered 2015-03-06: 19:00:00 via INTRAVENOUS

## 2015-03-06 MED ORDER — SODIUM CHLORIDE 0.9 % IJ SYRG
Freq: Once | INTRAMUSCULAR | Status: AC
Start: 2015-03-06 — End: 2015-03-06
  Administered 2015-03-06: 19:00:00 via INTRAVENOUS

## 2015-03-22 ENCOUNTER — Emergency Department: Admit: 2015-03-22 | Payer: MEDICARE | Primary: Family Medicine

## 2015-03-22 ENCOUNTER — Emergency Department: Admit: 2015-03-23 | Payer: MEDICARE | Primary: Family Medicine

## 2015-03-22 ENCOUNTER — Inpatient Hospital Stay
Admit: 2015-03-22 | Discharge: 2015-04-09 | Disposition: E | Payer: MEDICARE | Attending: Internal Medicine | Admitting: Internal Medicine

## 2015-03-22 DIAGNOSIS — I63531 Cerebral infarction due to unspecified occlusion or stenosis of right posterior cerebral artery: Secondary | ICD-10-CM

## 2015-03-22 NOTE — ED Notes (Signed)
Hospitalist MD at bedside at this time

## 2015-03-22 NOTE — H&P (Signed)
Hospitalist Admission Note    NAME: Brad Robles   DOB:  [birth year]   MRN:  [id number]     Date/Time:  03/28/2015 11:36 PM    Patient PCP: Knox Saliva, MD  ________________________________________________________________________    My assessment of this patient's clinical condition and my plan of care is as follows.    Assessment / Plan:   New-onset aphasia and rt sided weakness : DDX post-ictal phenomena vs acute   Stroke- no tpa candidate   Daughter only wants comfort measures at this time. Pt has active DNR.   - ct head- see full report  - telemetry  - permissive HTN to 220/110 with PRN labetalol  - start ASA 67m when able to tolerate PO  - lipid panel, A1C  - atorvastatin 872mwhen able to tolerate PO  neuro to see and make further w/u  Given Daughter only wants comfort measures at this time. Pt has active DNR.  - consider  MRI if demonstrates acute stroke, would then pursue echocardiogram    Lung Cancer : with unknown treatment course and possible bone  metastasis  -consult oncology   PRN morphine        Elevated troponin   Daughter only wants comfort measures at this time. Pt has active DNR.     F/E/N: NPO  Normal saline at 10064mr/morphine prn pain  PPX: heparin  Code: Daughter( SLovey [id number] wants comfort measures at this time. Pt has active DNR.                       Subjective:   CHIEF COMPLAINT: rt sided weakness     HISTORY OF PRESENT ILLNESS:  Records obtained from er   BilBrodyn Depuy a 81 57o.  [id number] PMhx significant for COPD,  Stage 4 Lung CA, Bone CA, lymph node CA who presents via EMS to the ED with cc of sudden onset of right extremity weakness. Per daughter states that pt went to use the bathroom and was found on the floor. Patients last known well time ~ 1800. Daughter notes that pts right extremities were not being used due to weakness. Daughter also c/o right sided facial droop. EMS reports pt blood glucose levels were 130 en route. EMS states pt opens eyes on  commands but notes pt is not verbal or following other commands. Per pts family pt takes only an inhaler at home.of note pt has not seen by onco to date.Daughter only wants comfort measures at this time. Pt has active DNR.     We were asked to admit for work up and evaluation of the above problems.     Past Medical History   Diagnosis Date   ??? COPD (chronic obstructive pulmonary disease) (HCC)[place] ??? [facility]ures ([facility] [facility]      Past Surgical History   Procedure Laterality Date   ??? Hx colonoscopy  2011   ??? Hx orthopaedic       neck surgery for pinched nerves       Social History   Substance Use Topics   ??? Smoking status: Never Smoker   ??? Smokeless tobacco: Not on file   ??? Alcohol use Yes      Comment: occasionally        History reviewed. No pertinent family history.  Allergies   Allergen Reactions   ??? Barium Sulfate Other (comments)  Headaches     ??? Demerol [Meperidine] Other (comments)     "jerks" Slip into "black hole"     ??? Novocain [Procaine] Nausea and Vomiting   ??? Sulfa (Sulfonamide Antibiotics) Unknown (comments)     Reports as child he was told and can't remember          Prior to Admission medications    Medication Sig Start Date End Date Taking? Authorizing Provider   tiotropium (SPIRIVA WITH HANDIHALER) 18 mcg inhalation capsule Take 1 Cap by inhalation daily.   Yes Phys Other, MD   oxyCODONE-acetaminophen (PERCOCET) 5-325 mg per tablet Take 1 Tab by mouth every four (4) hours as needed. Max Daily Amount: 6 Tabs. 02/21/15   Alain Marion, MD   promethazine (PHENERGAN) 25 mg tablet Take 1 Tab by mouth every six (6) hours as needed for Nausea. 02/21/15   Alain Marion, MD   cyclobenzaprine (FLEXERIL) 10 mg tablet Take 10 mg by mouth three (3) times daily as needed for Muscle Spasm(s).    Phys Other, MD       REVIEW OF SYSTEMS:     I am not able to complete the review of systems because:   The patient is intubated and sedated    x The patient has altered mental status due to his acute medical problems    The patient has baseline aphasia from prior stroke(s)    The patient has baseline dementia and is not reliable historian    The patient is in acute medical distress and unable to provide information             Objective:   VITALS:    Visit Vitals   ??? BP (!) 148/94   ??? Pulse 87   ??? Resp 19   ??? Ht 5' 10"  (1.778 m)   ??? Wt 66.7 kg (147 lb 0.8 oz)   ??? SpO2 96%   ??? BMI 21.1 kg/m2       PHYSICAL EXAM:    General:    Non verbal , no distress, appears older than stated age.   Cachectic   HEENT: Atraumatic, anicteric sclerae, +pallor noted     No oral ulcers, mucosa dry  throat clear, dentition poor  Neck:  Supple, symmetrical,  thyroid: non tender  Lungs:   Clear to auscultation rt side lft side decrease bs.  No Wheezing or Rhonchi. No rales.  Chest wall:  No tenderness  No Accessory muscle use.  Heart:   Regular  rhythm,  No  murmur   No edema  Abdomen:   Soft, non-tender. Not distended.  Bowel sounds normal  Extremities: No cyanosis.  No clubbing      Capillary refill normal,  Radial pulse 2+,   Skin:     Not pale.  Not Jaundiced  No rashes   Psych:  Good insight.  Not depressed.  Not anxious or agitated.  Neurologic: ++ aphasia  now non-verbal, unable to follow commands, and unable to move his R side.   ________________________________________________  Care Plan discussed with:    Comments   Patient x    Family  x    RN     Care Manager                    Consultant:      _______________________________________________________________________  Expected  Disposition:   Home with Family    HH/PT/OT/RN x   SNF/LTC    [facility]  ________________________________________________________________________  TOTAL TIME:  75 Minutes    Critical Care Provided     Minutes non procedure based      Comments    xx Reviewed previous records   >50% of visit spent in counseling and coordination of care xx Discussion with patient and/or family and questions answered        ________________________________________________________________________  Signed: Eldridge Dace, MD    Procedures: see electronic medical records for all procedures/Xrays and details which were not copied into this note but were reviewed prior to creation of Plan.    LAB DATA REVIEWED:    Recent Results (from the past 24 hour(s))   GLUCOSE, POC    Collection Time: 03/30/2015  7:11 PM   Result Value Ref Range    Glucose (POC) 146 (H) 65 - 100 mg/dL    Performed by Joie Bimler (ED tech)    POC INR    Collection Time: 03/20/2015  7:13 PM   Result Value Ref Range    INR (POC) 1.2 (H) <1.2     CBC WITH AUTOMATED DIFF    Collection Time: 03/30/2015  7:14 PM   Result Value Ref Range    WBC 10.2 4.1 - 11.1 K/uL    RBC 4.59 4.10 - 5.70 M/uL    HGB 13.9 12.1 - 17.0 g/dL    HCT 40.5 36.6 - 50.3 %    MCV 88.2 80.0 - 99.0 FL    MCH 30.3 26.0 - 34.0 PG    MCHC 34.3 30.0 - 36.5 g/dL    RDW 14.0 11.5 - 14.5 %    PLATELET 126 (L) 150 - 400 K/uL    NEUTROPHILS 65 32 - 75 %    LYMPHOCYTES 21 12 - 49 %    MONOCYTES 8 5 - 13 %    EOSINOPHILS 6 0 - 7 %    BASOPHILS 0 0 - 1 %    ABS. NEUTROPHILS 6.7 1.8 - 8.0 K/UL    ABS. LYMPHOCYTES 2.1 0.8 - 3.5 K/UL    ABS. MONOCYTES 0.8 0.0 - 1.0 K/UL    ABS. EOSINOPHILS 0.6 (H) 0.0 - 0.4 K/UL    ABS. BASOPHILS 0.0 0.0 - 0.1 K/UL   PROTHROMBIN TIME + INR    Collection Time: 03/21/2015  7:14 PM   Result Value Ref Range    INR 1.2 (H) 0.9 - 1.1      Prothrombin time 12.4 (H) 9.0 - 86.5 sec   METABOLIC PANEL, COMPREHENSIVE    Collection Time: 04/06/2015  7:14 PM   Result Value Ref Range    Sodium 140 136 - 145 mmol/L    Potassium 3.6 3.5 - 5.1 mmol/L    Chloride 103 97 - 108 mmol/L    CO2 28 21 - 32 mmol/L    Anion gap 9 5 - 15 mmol/L    Glucose 143 (H) 65 - 100 mg/dL    BUN 14 6 - 20 MG/DL    Creatinine 0.62 (L) 0.70 - 1.30 MG/DL    BUN/Creatinine ratio 23 (H) 12 - 20      GFR est AA >60 >60 ml/min/1.55m    GFR est non-AA >60 >60 ml/min/1.734m   Calcium 8.6 8.5 - 10.1 MG/DL     Bilirubin, total 0.5 0.2 - 1.0 MG/DL    ALT (SGPT) 17 12 - 78 U/L    AST (SGOT) 23 15 - 37 U/L    Alk. phosphatase 109 45 - 117 U/L  Protein, total 6.1 (L) 6.4 - 8.2 g/dL    Albumin 2.7 (L) 3.5 - 5.0 g/dL    Globulin 3.4 2.0 - 4.0 g/dL    A-G Ratio 0.8 (L) 1.1 - 2.2     PTT    Collection Time: 04/04/2015  7:14 PM   Result Value Ref Range    aPTT 25.3 22.1 - 32.5 sec    aPTT, therapeutic range     58.0 - 77.0 SECS   CK W/ CKMB & INDEX    Collection Time: 03/26/2015  7:14 PM   Result Value Ref Range    CK 32 (L) 39 - 308 U/L    CK - MB 1.0 <3.6 NG/ML    CK-MB Index 3.1 (H) 0 - 2.5     TROPONIN I    Collection Time: 03/21/2015  7:14 PM   Result Value Ref Range    Troponin-I, Qt. 0.09 (H) <0.05 ng/mL   MAGNESIUM    Collection Time: 04/08/2015  7:14 PM   Result Value Ref Range    Magnesium 2.2 1.6 - 2.4 mg/dL   LACTIC ACID, PLASMA    Collection Time: 03/17/2015  7:19 PM   Result Value Ref Range    Lactic acid 1.6 0.4 - 2.0 MMOL/L   TYPE & SCREEN    Collection Time: 04/02/2015  7:19 PM   Result Value Ref Range    Crossmatch Expiration 03/25/2015     ABO/Rh(D) B NEGATIVE     Antibody screen NEG    EKG, 12 LEAD, INITIAL    Collection Time: 03/31/2015  7:20 PM   Result Value Ref Range    Ventricular Rate 92 BPM    Atrial Rate 92 BPM    P-R Interval 132 ms    QRS Duration 128 ms    Q-T Interval 388 ms    QTC Calculation (Bezet) 479 ms    Calculated P Axis 41 degrees    Calculated R Axis 164 degrees    Calculated T Axis 25 degrees    Diagnosis       Normal sinus rhythm  Right bundle branch block  Septal infarct , age undetermined  Possible Lateral infarct , age undetermined  Cannot rule out Inferior infarct (cited on or before 19-Feb-2015)  When compared with ECG of 19-Feb-2015 16:19,  premature atrial complexes are no longer present  Septal infarct is now present  Borderline criteria for Lateral infarct are now present

## 2015-03-22 NOTE — ED Notes (Signed)
Tele Neuro MD Nena Jordan at bedside at this time evaluating patient

## 2015-03-22 NOTE — ED Notes (Signed)
Assumed care, pt resting in bed, call bell within reach. Family member at bedside

## 2015-03-22 NOTE — ED Notes (Signed)
Pt neuro status remains the same at this time. Call light within reach. Family x4 at bedside.

## 2015-03-22 NOTE — ED Notes (Signed)
Daughter updated on plan of care at this time.

## 2015-03-22 NOTE — ED Notes (Signed)
Pt nodding to answer yes or no questions at this time. Pt nods yes when asked if he is in pain. Pt pointing to head with LUE when asked where he is hurting. MD made aware.

## 2015-03-22 NOTE — ED Provider Notes (Signed)
HPI Comments: Brad Robles is a 81 y.o. male with PMhx significant for COPD, Seizures, Stage 4 Lung CA,  Bone CA, lymph node CA who presents via EMS to the ED with cc of sudden onset of right extremity weakness. Per daughter states that pt went to use the bathroom and was found on the floor. Patients last known well time ~ 1800. Daughter notes that pts right extremities were not being used due to weakness. Daughter also c/o right sided facial droop. EMS reports pt blood glucose levels were 130 en route. EMS states pt opens eyes on commands but notes pt is not verbal or following other commands.     Social history significant for: - Tobacco, + EtOH, - Illicit drug use    PCP: None    There are no other complaints, changes or physical findings at this time.  Written by Brad Robles, ED Scribe, as dictated by Brad Pert, DO      The history is provided by the EMS personnel and a relative. No language interpreter was used.        Past Medical History:   Diagnosis Date   ??? COPD (chronic obstructive pulmonary disease) ([place])    ??? Seizures ([place])    ??? Stage 4 lung cancer [facility])        Past Surgical History:   Procedure Laterality Date   ??? Hx colonoscopy  2011   ??? Hx orthopaedic       neck surgery for pinched nerves         History reviewed. No pertinent family history.    Social History     Social History   ??? Marital status: DIVORCED     Spouse name: N/A   ??? Number of children: N/A   ??? Years of education: N/A     Occupational History   ??? Not on file.     Social History Main Topics   ??? Smoking status: Never Smoker   ??? Smokeless tobacco: Not on file   ??? Alcohol use Yes      Comment: occasionally   ??? Drug use: No   ??? Sexual activity: Not on file     Other Topics Concern   ??? Not on file     Social History Narrative         ALLERGIES: Barium sulfate; Demerol [meperidine]; Novocain [procaine]; and Sulfa (sulfonamide antibiotics)    Review of Systems   Unable to perform ROS: Acuity of condition      Patient Vitals for the past 12 hrs:   Pulse Resp BP SpO2   03/14/2015 2115 93 18 (!) 136/93 96 %   03/29/2015 2100 93 18 137/89 95 %   03/14/2015 2045 87 15 122/86 97 %   04/01/2015 2030 87 16 131/86 97 %   03/18/2015 2015 92 19 130/90 97 %   03/28/2015 2000 87 16 119/83 97 %   03/12/2015 1945 92 19 134/87 96 %   04/01/2015 1930 90 21 (!) 124/94 95 %   03/16/2015 1912 92 24 - 95 %   04/05/2015 1909 - - - 93 %   03/21/2015 1906 91 22 (!) 141/94 (!) 86 %   03/21/2015 1904 - - (!) 141/94 -     Physical Exam   Constitutional: He appears well-developed and well-nourished. He appears cachectic. No distress.   HENT:   Head: Normocephalic and atraumatic.   Mouth/Throat: Oropharynx is clear and moist.   Eyes: Conjunctivae and EOM are normal.  Neck: Neck supple. No JVD present. No tracheal deviation present.   Cardiovascular: Normal rate, regular rhythm and intact distal pulses.  Exam reveals no gallop and no friction rub.    No murmur heard.  Regular rate and rhythm   No peripheral edema   Pulmonary/Chest: Effort normal and breath sounds normal. No stridor. Tachypnea noted. No respiratory distress. He has no wheezes.   Increased work with breathing with accessory muscle use.   Diminished breath sounds of left lung.   Right lung clear to ausculation.    Abdominal: Soft. Bowel sounds are normal. He exhibits no distension and no mass. There is no tenderness. There is no guarding.   Soft     Musculoskeletal: He exhibits no edema or tenderness.   No deformity   Neurological:   Non-verbal  Opens eyes to physical stimulation and verbal commands.  Moves left lower and upper extremities spontaneously. No movement of the RUE or RLE, does not withdraw RUE or RLE to painful stimuli.   Not following verbal commands.    Skin: Skin is warm, dry and intact. No rash noted. There is pallor.   Nursing note and vitals reviewed.       MDM  Number of Diagnoses or Management Options  Acute respiratory failure with hypoxia [facility]):    Cerebrovascular accident (CVA), unspecified mechanism [facility]):   Diagnosis management comments: Pt with new onset R-sided weakness that began around 6 pm. Pt is now non-verbal, unable to follow commands, and unable to move his R side. Pt also has diminished breath sounds in the L chest, hypoxia, and increased work of breathing. Pt was recently diagnosed with stage 4 lung cancer and had a recent lung biopsy done. DDx includes CVA, metastasis, pneumonia, pulmonary edema, pleural effusion, ACS, worsening tumor burden, PE. Will check labs, head CT, cardiac enzymes, CXR. Code S initiated, neurology paged. Admit.        Amount and/or Complexity of Data Reviewed  Clinical lab tests: ordered and reviewed  Tests in the radiology section of CPT??: ordered and reviewed  Tests in the medicine section of CPT??: ordered and reviewed  Obtain history from someone other than the patient: yes (EMS  Daughter)  Review and summarize past medical records: yes  Discuss the patient with other providers: yes (Neurology  Hospitalist )  Independent visualization of images, tracings, or specimens: yes    Critical Care  Total time providing critical care: 30-74 minutes    Patient Progress  Patient progress: stable    Procedures    Brad Robles  1934/12/04    Arrival time to ED: [place]: Pre -alert 1844    Physician at Bedside: [place]     CT Order Time: 1855    ACT Page: Darlin Drop    ACT Call Back: 1920    EKG interpretation: (Preliminary) 1920  Rhythm: normal sinus rhythm; and regular . Rate (approx.): 92; Axis: right axis deviation; PR interval: normal; QRS interval: prolonged; ST/T wave: No ST wave changes; Other findings: right bundle branch block.  Written by Brad Robles, ED scribe, as dictated by Brad Pert, DO    CONSULT NOTE:  7:10 PM  Brad Pert, DO spoke with Brad Jordan, MD,  Specialty: Neurology  Discussed pt's hx, disposition, and available diagnostic and imaging  results. Reviewed care plans. Consultant recommends Patient is not a candidate for tPA. Daughter in agreement with plan of care.     PROGRESS NOTE:  7:47 PM  Pt has been re-evaluated. Daughter only wants comfort measures at this time. Pt has active DNR.   Written by Brad Robles, ED scribe, as dictated by Brad Pert, DO    PROGRESS NOTE:  8:16 PM  Pt has been re-evaluated. Pt is beginning to follow commands with his L hand. Pt flexes right toes to stimuli.  Written by Brad Robles, ED scribe, as dictated by Brad Pert, DO    CONSULT NOTE:   9:15 PM  Brad Pert, DO spoke with Marland Mcalpine, MD   Specialty: Hospitalist  Discussed pt's hx, disposition, and available diagnostic and imaging results. Reviewed care plans. Consultant will evaluate pt for admission.  Written by Brad Robles, ED Scribe, as dictated by Brad Pert, DO.      CRITICAL CARE NOTE :      IMPENDING DETERIORATION -Airway, Respiratory and CNS    ASSOCIATED RISK FACTORS - Hypoxia and CNS Decompensation    MANAGEMENT- Bedside Assessment and Supervision of Care    INTERPRETATION -  Xrays, CT Scan, ECG and Blood Pressure    INTERVENTIONS - vascular control    CASE REVIEW - Hospitalist, Medical Sub-Specialist, Nursing and Family    TREATMENT RESPONSE -Improved    PERFORMED BY - Self        NOTES   :      I have spent 73 minutes of critical care time involved in lab review, consultations with specialist, family decision- making, bedside attention and documentation. During this entire length of time I was immediately available to the patient .    Brad Pert, DO      LABORATORY TESTS:  Recent Results (from the past 12 hour(s))   GLUCOSE, POC    Collection Time: 03/26/2015  7:11 PM   Result Value Ref Range    Glucose (POC) 146 (H) 65 - 100 mg/dL    Performed by Joie Bimler (ED tech)    POC INR    Collection Time: 04/03/2015  7:13 PM   Result Value Ref Range    INR (POC) 1.2 (H) <1.2     CBC WITH AUTOMATED DIFF     Collection Time: 03/21/2015  7:14 PM   Result Value Ref Range    WBC 10.2 4.1 - 11.1 K/uL    RBC 4.59 4.10 - 5.70 M/uL    HGB 13.9 12.1 - 17.0 g/dL    HCT 40.5 36.6 - 50.3 %    MCV 88.2 80.0 - 99.0 FL    MCH 30.3 26.0 - 34.0 PG    MCHC 34.3 30.0 - 36.5 g/dL    RDW 14.0 11.5 - 14.5 %    PLATELET 126 (L) 150 - 400 K/uL    NEUTROPHILS 65 32 - 75 %    LYMPHOCYTES 21 12 - 49 %    MONOCYTES 8 5 - 13 %    EOSINOPHILS 6 0 - 7 %    BASOPHILS 0 0 - 1 %    ABS. NEUTROPHILS 6.7 1.8 - 8.0 K/UL    ABS. LYMPHOCYTES 2.1 0.8 - 3.5 K/UL    ABS. MONOCYTES 0.8 0.0 - 1.0 K/UL    ABS. EOSINOPHILS 0.6 (H) 0.0 - 0.4 K/UL    ABS. BASOPHILS 0.0 0.0 - 0.1 K/UL   PROTHROMBIN TIME + INR    Collection Time: 03/13/2015  7:14 PM   Result Value Ref Range    INR 1.2 (H) 0.9 - 1.1      Prothrombin time 12.4 (H)  9.0 - 13.2 sec   METABOLIC PANEL, COMPREHENSIVE    Collection Time: 03/26/2015  7:14 PM   Result Value Ref Range    Sodium 140 136 - 145 mmol/L    Potassium 3.6 3.5 - 5.1 mmol/L    Chloride 103 97 - 108 mmol/L    CO2 28 21 - 32 mmol/L    Anion gap 9 5 - 15 mmol/L    Glucose 143 (H) 65 - 100 mg/dL    BUN 14 6 - 20 MG/DL    Creatinine 0.62 (L) 0.70 - 1.30 MG/DL    BUN/Creatinine ratio 23 (H) 12 - 20      GFR est AA >60 >60 ml/min/1.74m    GFR est non-AA >60 >60 ml/min/1.754m   Calcium 8.6 8.5 - 10.1 MG/DL    Bilirubin, total 0.5 0.2 - 1.0 MG/DL    ALT (SGPT) 17 12 - 78 U/L    AST (SGOT) 23 15 - 37 U/L    Alk. phosphatase 109 45 - 117 U/L    Protein, total 6.1 (L) 6.4 - 8.2 g/dL    Albumin 2.7 (L) 3.5 - 5.0 g/dL    Globulin 3.4 2.0 - 4.0 g/dL    A-G Ratio 0.8 (L) 1.1 - 2.2     PTT    Collection Time: 04/04/2015  7:14 PM   Result Value Ref Range    aPTT 25.3 22.1 - 32.5 sec    aPTT, therapeutic range     58.0 - 77.0 SECS   CK W/ CKMB & INDEX    Collection Time: 03/21/2015  7:14 PM   Result Value Ref Range    CK 32 (L) 39 - 308 U/L    CK - MB 1.0 <3.6 NG/ML    CK-MB Index 3.1 (H) 0 - 2.5     TROPONIN I    Collection Time: 04/01/2015  7:14 PM    Result Value Ref Range    Troponin-I, Qt. 0.09 (H) <0.05 ng/mL   MAGNESIUM    Collection Time: 04/01/2015  7:14 PM   Result Value Ref Range    Magnesium 2.2 1.6 - 2.4 mg/dL   LACTIC ACID, PLASMA    Collection Time: 03/14/2015  7:19 PM   Result Value Ref Range    Lactic acid 1.6 0.4 - 2.0 MMOL/L   TYPE & SCREEN    Collection Time: 03/16/2015  7:19 PM   Result Value Ref Range    Crossmatch Expiration 03/25/2015     ABO/Rh(D) B NEGATIVE     Antibody screen NEG    EKG, 12 LEAD, INITIAL    Collection Time: 03/25/2015  7:20 PM   Result Value Ref Range    Ventricular Rate 92 BPM    Atrial Rate 92 BPM    P-R Interval 132 ms    QRS Duration 128 ms    Q-T Interval 388 ms    QTC Calculation (Bezet) 479 ms    Calculated P Axis 41 degrees    Calculated R Axis 164 degrees    Calculated T Axis 25 degrees    Diagnosis       Normal sinus rhythm  Right bundle branch block  Septal infarct , age undetermined  Possible Lateral infarct , age undetermined  Cannot rule out Inferior infarct (cited on or before 19-Feb-2015)  When compared with ECG of 19-Feb-2015 16:19,  premature atrial complexes are no longer present  Septal infarct is now present  Borderline criteria for Lateral infarct are  now present         IMAGING RESULTS:  CTA CHEST W WO CONT   Final Result   INDICATION: Pt with stage 4 lung cancer, now with hypoxia  ??  COMPARISON: 02/19/2015  ??  TECHNIQUE:   Precontrast scout images were obtained to localize the volume for acquisition.  Multislice helical CT arteriography was performed from the diaphragm to the  thoracic inlet during uneventful rapid bolus intravenous administration of 80 mL  of Isovue-370. Lung and soft tissue windows were generated. Post processing was  performed and coronal reformatted images were also generated. 3 D imaging was  performed. CT dose reduction was achieved through use of a standardized  protocol tailored for this examination and automatic exposure control for dose  modulation.   ??  FINDINGS:   Thyroid gland is unremarkable.  ??  No pulmonary emboli identified.  ??  There is a large left effusion which is progressed considerably since the prior  study with left lower lobe atelectasis and partial right upper lobe atelectasis.  Mucus is identified within the trachea. There is slight atelectasis right base.  ??  Left hilar and mediastinal adenopathy is unchanged.  ??  IMPRESSION  IMPRESSION:   1. No pulmonary emboli identified.  2. There is a large left effusion with atelectasis of the left lower lobe and  partial atelectasis left upper lobe. Left hilar and mediastinal adenopathy is  unchanged. There is slight atelectasis right base. There is mucus within the  trachea..   XR CHEST PORT   Final Result   CXR Results  (Last 48 hours)               04/07/2015 1929  XR CHEST PORT Final result    Impression:  IMPRESSION:   Significant increase in opacification of the left mid and lower lung field since   the prior study, consistent with pleural effusion and possible mass or   infiltrate in the left lower lobe as previously described..  .           Narrative:  INDICATION:  hypoxia, hx of lung cancer        EXAM: Chest single view.       COMPARISON: 02/21/2015.       FINDINGS: A single frontal view of the chest at 1924 hours shows near-complete   opacification of the left hemithorax with remaining aerated lung in the left   upper lobe. Left pleural effusion is suggested, but there may be infiltrate,   atelectasis or mass in the left lower lobe. The right lung shows mild   interstitial prominence but no focal infiltrate..  The heart, mediastinum and   pulmonary vasculature are stable .  The bony thorax is unremarkable for age,   except for stable cervical fusion. .                CT CODE NEURO HEAD WO CONTRAST   Final Result   INDICATION: right side weakness aphasia  ??  EXAM: CT HEAD WITHOUT CONTRAST.  ??  COMPARISON: None .  ??  PROCEDURE: Unenhanced head CT. Axial images were obtained from skull base to the   vertex. Images were reformatted in the coronal and sagittal planes. Soft tissue  and bone windows were examined. No contrast. CT dose reduction was achieved  through use of a standardized protocol tailored for this examination and  automatic exposure control for dose modulation.   ??  FINDINGS: Image quality is degraded by patient motion  artifact. Cerebral sulci  and ventricular size are mildly increased, but commensurate with age. There are  patchy diffuse mild periventricular white matter changes. While nonspecific  these are likely due to small vessel ischemic change. There is a focal area of  low density, wedge-shaped, and the right occipital lobe without significant  volume loss.. There is no evidence of midline shift, extra-axial collection,  mass lesion or mass effect. No evidence of acute bleed. No acute bony  abnormality. No obvious acute ischemia. .  ??  IMPRESSION  IMPRESSION:  1. Focal low density in the right occipital lobe, which may represent acute or  subacute ischemia, without hemorrhage or mass effect.  2. Microvascular ischemic and other age-related changes.       MEDICATIONS GIVEN:  Medications   0.9% sodium chloride infusion (50 mL/hr IntraVENous New Bag 04/04/2015 2206)   iopamidol (ISOVUE-370) 76 % injection 100 mL (80 mL IntraVENous Given 04/05/2015 2206)   sodium chloride (NS) flush 10 mL (10 mL IntraVENous Given 03/30/2015 2206)       IMPRESSION:  1. Cerebrovascular accident (CVA), unspecified mechanism ([place])    2. Acute respiratory failure with hypoxia (H[facility])    3. Pleural effusion, left        PLAN:  1. Admit to hospitalist    ADMIT NOTE:  8:06 PM  Patient is being admitted to the hospital.  The results of their tests and reasons for their admission have been discussed with them and/or available family.  They convey agreement and understanding for the need to be admitted and for their admission diagnosis.  Consultation has been made with the inpatient physician specialist for hospitalization.     This note is prepared by Brad Robles, acting as Scribe for Brad Pert, DO.    Brad Pert, DO: The scribe's documentation has been prepared under my direction and personally reviewed by me in its entirety. I confirm that the note above accurately reflects all work, treatment, procedures, and medical decision making performed by me.

## 2015-03-22 NOTE — ED Notes (Signed)
Pt neuro check preformed at this time. Pt able to follow command and grip RN fingers with left hand. Pt responsive to RN stimulus to right foot with toe flexion. Pt remains unable to move or lift right RUE or RLE at this time. ER MD made aware of patient neuro change at this time. No orders received.

## 2015-03-23 ENCOUNTER — Inpatient Hospital Stay: Payer: MEDICARE | Primary: Family Medicine

## 2015-03-23 LAB — PROTHROMBIN TIME + INR
INR: 1.2 — ABNORMAL HIGH (ref 0.9–1.1)
Prothrombin time: 12.4 s — ABNORMAL HIGH (ref 9.0–11.1)

## 2015-03-23 LAB — EKG, 12 LEAD, INITIAL
Atrial Rate: 92 {beats}/min
Calculated P Axis: 41 degrees
Calculated R Axis: 164 degrees
Calculated T Axis: 25 degrees
Diagnosis: NORMAL
P-R Interval: 132 ms
Q-T Interval: 388 ms
QRS Duration: 128 ms
QTC Calculation (Bezet): 479 ms
Ventricular Rate: 92 {beats}/min

## 2015-03-23 LAB — VITAMIN B12 & FOLATE
Folate: 7.8 ng/mL (ref 5.0–21.0)
Vitamin B12: 457 pg/mL (ref 211–911)

## 2015-03-23 LAB — TYPE & SCREEN
ABO/Rh(D): B NEG
Antibody screen: NEGATIVE

## 2015-03-23 LAB — CBC WITH AUTOMATED DIFF
ABS. BASOPHILS: 0 10*3/uL (ref 0.0–0.1)
ABS. EOSINOPHILS: 0.6 10*3/uL — ABNORMAL HIGH (ref 0.0–0.4)
ABS. LYMPHOCYTES: 2.1 10*3/uL (ref 0.8–3.5)
ABS. MONOCYTES: 0.8 10*3/uL (ref 0.0–1.0)
ABS. NEUTROPHILS: 6.7 10*3/uL (ref 1.8–8.0)
BASOPHILS: 0 % (ref 0–1)
EOSINOPHILS: 6 % (ref 0–7)
HCT: 40.5 % (ref 36.6–50.3)
HGB: 13.9 g/dL (ref 12.1–17.0)
LYMPHOCYTES: 21 % (ref 12–49)
MCH: 30.3 PG (ref 26.0–34.0)
MCHC: 34.3 g/dL (ref 30.0–36.5)
MCV: 88.2 FL (ref 80.0–99.0)
MONOCYTES: 8 % (ref 5–13)
NEUTROPHILS: 65 % (ref 32–75)
PLATELET: 126 10*3/uL — ABNORMAL LOW (ref 150–400)
RBC: 4.59 M/uL (ref 4.10–5.70)
RDW: 14 % (ref 11.5–14.5)
WBC: 10.2 10*3/uL (ref 4.1–11.1)

## 2015-03-23 LAB — METABOLIC PANEL, COMPREHENSIVE
A-G Ratio: 0.8 — ABNORMAL LOW (ref 1.1–2.2)
ALT (SGPT): 17 U/L (ref 12–78)
AST (SGOT): 23 U/L (ref 15–37)
Albumin: 2.7 g/dL — ABNORMAL LOW (ref 3.5–5.0)
Alk. phosphatase: 109 U/L (ref 45–117)
Anion gap: 9 mmol/L (ref 5–15)
BUN/Creatinine ratio: 23 — ABNORMAL HIGH (ref 12–20)
BUN: 14 MG/DL (ref 6–20)
Bilirubin, total: 0.5 MG/DL (ref 0.2–1.0)
CO2: 28 mmol/L (ref 21–32)
Calcium: 8.6 MG/DL (ref 8.5–10.1)
Chloride: 103 mmol/L (ref 97–108)
Creatinine: 0.62 MG/DL — ABNORMAL LOW (ref 0.70–1.30)
GFR est AA: >60 mL/min/{1.73_m2} (ref >60)
GFR est non-AA: >60 mL/min/{1.73_m2} (ref >60)
Globulin: 3.4 g/dL (ref 2.0–4.0)
Glucose: 143 mg/dL — ABNORMAL HIGH (ref 65–100)
Potassium: 3.6 mmol/L (ref 3.5–5.1)
Protein, total: 6.1 g/dL — ABNORMAL LOW (ref 6.4–8.2)
Sodium: 140 mmol/L (ref 136–145)

## 2015-03-23 LAB — MAGNESIUM: Magnesium: 2.2 mg/dL (ref 1.6–2.4)

## 2015-03-23 LAB — CK W/ CKMB & INDEX
CK - MB: 1 NG/ML (ref <3.6)
CK-MB Index: 3.1 — ABNORMAL HIGH (ref 0–2.5)
CK: 32 U/L — ABNORMAL LOW (ref 39–308)

## 2015-03-23 LAB — GLUCOSE, POC: Glucose (POC): 146 mg/dL — ABNORMAL HIGH (ref 65–100)

## 2015-03-23 LAB — LACTIC ACID: Lactic acid: 1.6 MMOL/L (ref 0.4–2.0)

## 2015-03-23 LAB — TROPONIN I: Troponin-I, Qt.: 0.09 ng/mL — ABNORMAL HIGH (ref <0.05)

## 2015-03-23 LAB — POC INR: INR (POC): 1.2 — ABNORMAL HIGH (ref <1.2)

## 2015-03-23 LAB — PTT: aPTT: 25.3 s (ref 22.1–32.5)

## 2015-03-23 LAB — HOMOCYSTEINE, PLASMA: Homocysteine, plasma: 8.9 umol/L (ref 3.7–13.9)

## 2015-03-23 MED ORDER — IBUPROFEN 600 MG TAB
600 mg | Freq: Four times a day (QID) | ORAL | Status: DC | PRN
Start: 2015-03-23 — End: 2015-03-23

## 2015-03-23 MED ORDER — IBUPROFEN 100 MG/5 ML ORAL SUSP
100 mg/5 mL | Freq: Four times a day (QID) | ORAL | Status: DC | PRN
Start: 2015-03-23 — End: 2015-03-23

## 2015-03-23 MED ORDER — ACETAMINOPHEN 325 MG TABLET
325 mg | ORAL | Status: DC | PRN
Start: 2015-03-23 — End: 2015-03-23

## 2015-03-23 MED ORDER — ASPIRIN 300 MG RECTAL SUPPOSITORY
300 mg | Freq: Every day | RECTAL | Status: DC
Start: 2015-03-23 — End: 2015-03-24

## 2015-03-23 MED ORDER — SODIUM CHLORIDE 0.9 % IV
INTRAVENOUS | Status: DC
Start: 2015-03-23 — End: 2015-03-24
  Administered 2015-03-23 – 2015-03-24 (×3): via INTRAVENOUS

## 2015-03-23 MED ORDER — SODIUM CHLORIDE 0.9 % IJ SYRG
INTRAMUSCULAR | Status: DC | PRN
Start: 2015-03-23 — End: 2015-03-24
  Administered 2015-03-24 (×3): via INTRAVENOUS

## 2015-03-23 MED ORDER — SODIUM CHLORIDE 0.9 % IV
Freq: Once | INTRAVENOUS | Status: AC
Start: 2015-03-23 — End: 2015-03-22
  Administered 2015-03-23: 03:00:00 via INTRAVENOUS

## 2015-03-23 MED ORDER — IOPAMIDOL 76 % IV SOLN
370 mg iodine /mL (76 %) | Freq: Once | INTRAVENOUS | Status: AC
Start: 2015-03-23 — End: 2015-03-22
  Administered 2015-03-23: 03:00:00 via INTRAVENOUS

## 2015-03-23 MED ORDER — MORPHINE 2 MG/ML INJECTION
2 mg/mL | INTRAMUSCULAR | Status: AC
Start: 2015-03-23 — End: 2015-03-22
  Administered 2015-03-23: 04:00:00 via INTRAVENOUS

## 2015-03-23 MED ORDER — ACETAMINOPHEN 650 MG RECTAL SUPPOSITORY
650 mg | RECTAL | Status: DC | PRN
Start: 2015-03-23 — End: 2015-03-23

## 2015-03-23 MED ORDER — LORAZEPAM 2 MG/ML IJ SOLN
2 mg/mL | Freq: Four times a day (QID) | INTRAMUSCULAR | Status: DC | PRN
Start: 2015-03-23 — End: 2015-03-24
  Administered 2015-03-24 (×2): via INTRAVENOUS

## 2015-03-23 MED ORDER — ACETAMINOPHEN (TYLENOL) SOLUTION 32MG/ML
ORAL | Status: DC | PRN
Start: 2015-03-23 — End: 2015-03-23

## 2015-03-23 MED ORDER — UMECLIDINIUM 62.5 MCG/ACTUATION BLISTER POWDER FOR INHALATION
62.5 mcg/actuation | Freq: Every day | RESPIRATORY_TRACT | Status: DC
Start: 2015-03-23 — End: 2015-03-24

## 2015-03-23 MED ORDER — HEPARIN (PORCINE) 5,000 UNIT/ML IJ SOLN
5000 unit/mL | Freq: Two times a day (BID) | INTRAMUSCULAR | Status: DC
Start: 2015-03-23 — End: 2015-03-24
  Administered 2015-03-23 (×2): via SUBCUTANEOUS

## 2015-03-23 MED ORDER — SODIUM CHLORIDE 0.9 % IJ SYRG
Freq: Once | INTRAMUSCULAR | Status: AC
Start: 2015-03-23 — End: 2015-03-22
  Administered 2015-03-23: 03:00:00 via INTRAVENOUS

## 2015-03-23 MED ORDER — SODIUM CHLORIDE 0.9 % IJ SYRG
Freq: Three times a day (TID) | INTRAMUSCULAR | Status: DC
Start: 2015-03-23 — End: 2015-03-24
  Administered 2015-03-23 – 2015-03-24 (×6): via INTRAVENOUS

## 2015-03-23 MED ORDER — MORPHINE 2 MG/ML INJECTION
2 mg/mL | INTRAMUSCULAR | Status: DC | PRN
Start: 2015-03-23 — End: 2015-03-24
  Administered 2015-03-23 – 2015-03-24 (×4): via INTRAVENOUS

## 2015-03-23 MED FILL — MORPHINE 2 MG/ML INJECTION: 2 mg/mL | INTRAMUSCULAR | Qty: 1

## 2015-03-23 MED FILL — SODIUM CHLORIDE 0.9 % IV: INTRAVENOUS | Qty: 1000

## 2015-03-23 MED FILL — HEPARIN (PORCINE) 5,000 UNIT/ML IJ SOLN: 5000 unit/mL | INTRAMUSCULAR | Qty: 1

## 2015-03-23 MED FILL — BD POSIFLUSH NORMAL SALINE 0.9 % INJECTION SYRINGE: INTRAMUSCULAR | Qty: 10

## 2015-03-23 MED FILL — INCRUSE ELLIPTA 62.5 MCG/ACTUATION POWDER FOR INHALATION: 62.5 mcg/actuation | RESPIRATORY_TRACT | Qty: 7

## 2015-03-23 MED FILL — LORAZEPAM 2 MG/ML IJ SOLN: 2 mg/mL | INTRAMUSCULAR | Qty: 1

## 2015-03-23 NOTE — Progress Notes (Signed)
Patient unable to do [place]. Patient in respiratory distress trying to cough up secretions.

## 2015-03-23 NOTE — ED Notes (Signed)
Pt placed in hospital bed at this time. Call light within reach. Daughter remains at bedside.

## 2015-03-23 NOTE — ED Notes (Signed)
Pt voided 300 mL of amber urine at this time in urinal

## 2015-03-23 NOTE — ED Notes (Signed)
Daughter expressing desire for aggressive comfort care to neurologist at this time.  Neurology to order hospice at this time.  Spoke with [place] in case management to facilitate.

## 2015-03-23 NOTE — ED Notes (Signed)
Bedside shift change report given to Lauren B RN (oncoming nurse) by Bobbie Stack RN (offgoing nurse). Report included the following information SBAR, Kardex, ED Summary and MAR.

## 2015-03-23 NOTE — ED Notes (Signed)
Pt neuro status remains the same at this time. Family remains at bedside. Call light within reach

## 2015-03-23 NOTE — ED Notes (Signed)
Assumed pt care from Ander Purpura, [place]. Pt with family at bedside, given mouth sponges at request with water and instruction on how to use.

## 2015-03-23 NOTE — Hospice (Signed)
[place] [facility]  [phone]    Patient Name: Brad Robles  Date of Birth: [birth year]  Age: 81 y.o.    [place] RN Note:  Hospice consult noted, Chart reviewed, Plan of care reviewed with patients nurse & Care manager.     Call to Wyman Songster at [phone] message left requesting return call to schedule hospice information meeting.    Thank you for the opportunity to be of service to this patient.    Daughter called back and meeting scheduled for 5pm.

## 2015-03-23 NOTE — Other (Signed)
TRANSFER - OUT REPORT:    Verbal report given to sara rigsby, RN(name) on Brad Robles  being transferred to 3103(unit) for routine progression of care       Report consisted of patient???s Situation, Background, Assessment and   Recommendations(SBAR).     Information from the following report(s) SBAR, Kardex, ED Summary and MAR was reviewed with the receiving nurse.    Lines:   Peripheral IV 04/08/2015 Right Antecubital (Active)   Site Assessment Clean, dry, & intact 04/06/2015  7:18 PM   Phlebitis Assessment 0 03/28/2015  7:18 PM   Infiltration Assessment 0 03/31/2015  7:18 PM       Peripheral IV 04/06/2015 Right;Left Antecubital (Active)   Site Assessment Clean, dry, & intact 03/14/2015  7:18 PM   Phlebitis Assessment 0 04/03/2015  7:18 PM   Infiltration Assessment 0 03/31/2015  7:18 PM        Opportunity for questions and clarification was provided.      Patient transported with:   [employer]

## 2015-03-23 NOTE — Hospice (Signed)
[place] [facility]  [phone]     Patient Name: Momen Ham  Date of Birth: Mar 09, 1934  Age: 81 y.o.    [place] RN Note:  Hospice consult received, reviewing chart. Will follow up with Unit Nurse and Care Manager to discuss plan of care, patient status and discharge disposition within the hour.     Thank you for the opportunity to be of service to this patient.

## 2015-03-23 NOTE — Progress Notes (Addendum)
Hospitalist Progress Note    NAME: Grafton Warzecha   DOB:  10-27-1934   MRN:  [id number]       Interim Hospital Summary: 81 y.o. male whom presented on 04/08/2015 with      Assessment / Plan:  Right PCA territory CVA  L sided hemiparesis/aphasia  -CT head showed focal low density in the R occipital lobe, which may represent acute or subacute ischemia, without hemorrhage or mass effect  -TSH, A1C, lipid  -MRI head/ carotid duplex ordered  -SLP consulted  -plavix, asa once pt is able to swallow    Acute hypoxic respiratory failure  Lung CA, stage IV/pleural effusion  -per daughter, pt is on comfort measure  -morphine/ativan prn  -CT neg for PE    COPD  -O2 suppl prn, incruse ellipta inhaler  -nebs prn             Subjective:     Chief Complaint / Reason for Physician Visit  Patient seen at bedside accompanied by daughter and son in law.  Pt is nonverbal, sleeping in bed.  Daughter request to let him sleep since he has been up all night.  States he is aphasic, however, able to follows her commands.  Discussed with RN events overnight.     Review of Systems:  Symptom Y/N Comments  Symptom Y/N Comments   Fever/Chills    Chest Pain     Poor Appetite    Edema     Cough    Abdominal Pain     Sputum    Joint Pain     SOB/DOE    Pruritis/Rash     Nausea/vomit    Tolerating PT/OT     Diarrhea    Tolerating Diet     Constipation    Other       Could NOT obtain due to: sleeping     Objective:     VITALS:   Last 24hrs VS reviewed since prior progress note. Most recent are:  Patient Vitals for the past 24 hrs:   Pulse Resp BP SpO2   03/23/15 1200 87 - 151/87 94 %   03/23/15 1100 89 - (!) 131/98 92 %   03/23/15 1000 90 - (!) 131/94 94 %   03/23/15 0945 85 - 121/83 96 %   03/23/15 0930 84 - 120/86 97 %   03/23/15 0915 89 - 136/89 96 %   03/23/15 0900 94 - (!) 141/98 94 %   03/23/15 0830 87 14 124/87 97 %   03/23/15 0815 88 15 122/83 96 %   03/23/15 0800 87 15 123/87 96 %   03/23/15 0745 90 13 130/90 97 %    03/23/15 0730 97 18 (!) 146/99 96 %   03/23/15 0645 93 16 (!) 135/93 96 %   03/23/15 0622 100 20 135/86 91 %   03/23/15 0600 91 17 (!) 152/96 92 %   03/23/15 0545 93 14 (!) 138/94 97 %   03/23/15 0530 87 14 134/88 97 %   03/23/15 0515 89 14 137/90 97 %   03/23/15 0500 89 16 (!) 139/92 96 %   03/23/15 0445 99 18 (!) 156/98 91 %   03/23/15 0430 93 18 (!) 133/97 91 %   03/23/15 0415 92 18 142/90 92 %   03/23/15 0400 82 15 124/84 94 %   03/23/15 0345 81 14 130/87 95 %   03/23/15 0330 87 15 (!) 136/93 94 %   03/23/15 0245 86 15 (!)  136/97 96 %   03/23/15 0230 85 15 122/88 96 %   03/23/15 0215 83 16 124/87 94 %   03/23/15 0200 81 14 150/85 95 %   03/23/15 0145 83 16 (!) 141/93 96 %   03/23/15 0130 82 15 (!) 141/91 96 %   03/23/15 0015 85 15 (!) 144/93 95 %   03/23/15 0001 85 13 134/89 95 %   03/27/2015 2330 88 15 138/89 92 %   03/14/2015 2315 90 15 (!) 134/91 92 %   03/18/2015 2300 87 19 (!) 148/94 96 %   03/20/2015 2215 88 18 (!) 150/95 95 %   03/30/2015 2115 93 18 (!) 136/93 96 %   04/01/2015 2100 93 18 137/89 95 %   04/06/2015 2045 87 15 122/86 97 %   03/20/2015 2030 87 16 131/86 97 %   03/25/2015 2015 92 19 130/90 97 %   03/25/2015 2000 87 16 119/83 97 %   03/12/2015 1945 92 19 134/87 96 %   04/01/2015 1930 90 21 (!) 124/94 95 %   03/18/2015 1912 92 24 - 95 %   03/21/2015 1909 - - - 93 %   04/05/2015 1906 91 22 (!) 141/94 (!) 86 %   04/08/2015 1904 - - (!) 141/94 -       Intake/Output Summary (Last 24 hours) at 03/23/15 1326  Last data filed at 03/23/15 0317   Gross per 24 hour   Intake                0 ml   Output              300 ml   Net             -300 ml        PHYSICAL EXAM:  General: Thin male in bed sleeping??  EENT:  EOMI. Anicteric sclerae. MMM  Resp:  CTA bilaterally, no wheezing or rales.  No accessory muscle use  CV:  Regular  rhythm,?? No edema  GI:  Soft, Non distended, Non tender. ??+Bowel sounds  Neurologic:?? Sleeping in bed, unable to access neuro exam.  Psych:???? Good insight.??Not anxious nor agitated  Skin:  No rashes.  No jaundice     Reviewed most current lab test results and cultures  YES  Reviewed most current radiology test results   YES  Review and summation of old records today    NO  Reviewed patient's current orders and MAR    YES  PMH/SH reviewed - no change compared to H&P  ________________________________________________________________________  Care Plan discussed with:    Comments   Patient x    Family  x Daughter and son in Product manager  x neurology                     Multidiciplinary team rounds were held today with case manager, nursing, pharmacist and Occupational psychologist.  Patient's plan of care was discussed; medications were reviewed and discharge planning was addressed.     ________________________________________________________________________  Total NON critical care TIME:  45   Minutes    Total CRITICAL CARE TIME Spent:   Minutes non procedure based      Comments   >50% of visit spent in counseling and coordination of care x    ________________________________________________________________________  Malon Kindle, MD     Procedures: see electronic medical records for all procedures/Xrays  and details which were not copied into this note but were reviewed prior to creation of Plan.      LABS:  I reviewed today's most current labs and imaging studies.  Pertinent labs include:  Recent Labs      04/06/2015   1914   WBC  10.2   HGB  13.9   HCT  40.5   PLT  126*     Recent Labs      03/27/2015   1914  03/29/2015   1913   NA  140   --    K  3.6   --    CL  103   --    CO2  28   --    GLU  143*   --    BUN  14   --    CREA  0.62*   --    CA  8.6   --    MG  2.2   --    ALB  2.7*   --    TBILI  0.5   --    SGOT  23   --    ALT  17   --    INR  1.2*  1.2*       Signed: Malon Kindle, MD

## 2015-03-23 NOTE — ED Notes (Signed)
Pt. Incontinent of urine at this time. Peri care provided and patient cleaned.  Pt. Tolerated well. Pt. Placed back in position of comfort with call bell in reach and daughter at bedside.

## 2015-03-23 NOTE — ED Notes (Signed)
Spoke with oncology nurse.  Oncology MD to come see patient today.

## 2015-03-23 NOTE — ED Notes (Signed)
Pt. Resting comfortably in bed with eyes closed at this time.  Neurologist at bedside to evaluate patient.

## 2015-03-23 NOTE — ED Notes (Signed)
Oncology consult called.  On call nurse to call back.

## 2015-03-23 NOTE — ED Notes (Signed)
Pt incontinent of urine at this time. RN and PCT at bedside assisting patient with clean up at this time. Linen changed and gown changed at this time

## 2015-03-23 NOTE — Progress Notes (Signed)
..  *   No surgery found *  Bedside and Verbal shift change report given to Sharyn Lull (Soil scientist) by Judson Roch (offgoing nurse). Report included the following information SBAR, Kardex and ED Summary.    [facility] Phone:   [phone]      Significant changes during shift:   Stage I on sacrum, unable to tolerate MRI        Patient Information    Brad Robles  81 y.o.  03/18/2015  6:53 PM by Eldridge Dace, MD. Brad Robles was admitted from Home    Problem List    Patient Active Problem List    Diagnosis Date Noted   ??? CVA (cerebral vascular accident) ([place]) 04/07/2015   ??? Hemoptysis 02/21/2015   ??? Lung cancer ([place]) 02/19/2015     Past Medical History   Diagnosis Date   ??? COPD (chronic obstructive pulmonary disease) ([place])    ??? Seizures ([place])    ??? Stage 4 lung cancer ([facility])          Core Measures:    CVA: Yes Yes  CHF:No No  PNA:No No    Post Op Surgical (If Applicable):     Number times ambulated in hallway past shift:  0  Number of times OOB to chair past shift:     NG Tube: No  Incentive Spirometer: No  Drains: No   Volume    Dressing Present:  No  Flatus:  Yes    Activity Status:    OOB to Chair No  Ambulated this shift No   Bed Rest Yes    Supplemental O2: (If Applicable)    NC Yes  NRB No  Venti-mask No  On 2 Liters/min      LINES AND DRAINS:    Central Line? No Placement date  Reason Medically Necessary     PICC LINE? No Placement date Reason Medically Necessary     Urinary Catheter? No Placement Date  Reason Medically Necessary     DVT prophylaxis:    DVT prophylaxis Med- Yes  DVT prophylaxis SCD or TED- No     Wounds: (If Applicable)    Wounds- Yes    Location sacrum- stage I    Patient Safety:    Falls Score Total Score: 3  Safety Level_______  Bed Alarm On? Yes  Sitter? No    Plan for upcoming shift: catrotids        Discharge Plan: Yes     Active Consults:  IP CONSULT TO NEUROLOGY  IP CONSULT TO ONCOLOGY

## 2015-03-23 NOTE — ED Notes (Signed)
Pt sleeping in ED bed at this time. Call light within reach. Family at bedside.

## 2015-03-23 NOTE — Hospice (Addendum)
[facility]  [phone]    Patient Name: Brad Robles  Date of Birth: 08-Jan-1935  Age: 81 y.o.    [facility] RN Note:     In to meet with Brad Robles the pt's daughter.   Discussed Hospice philosophy, general plan of care, levels of care, services and on call procedures.  Family information packet provided & reviewed with daughter Brad Robles. Her sister was also present. They would both like to get more information on the pt's overall condition before making a decision to utilize hospice. Pt was in [place] living alone in a camper prior to coming to [place]. Brad Robles stated that he had not been feeling well for a while, he had lost weight, wasn't eating, having difficulty ambulating. Brad Robles moved him to [place] with her. She stated that she has made sure he has eaten and he has gained 5lbs in one month.    Pt came to [place] on Jan 9th. Shortly after his arrival [facility] took him to the ER. She stated he was diagnosed with Stage 4 lung cancer with mets to his bone and lymph nodes and was advised to follow up with an oncologist. Per Brad Robles he has an apt this coming Tuesday. He presented to the ER yesterday with stroke like symptoms and has been admitted for CVA. He is unable to speak, he has weakness on his right side, unable to squeeze my hand however he is able to move his toes some. His left pupil is barely reactive. Overall he appears to be comfortable. Both daughters feel that their father has improved over the past 20 hours while in the ER. While present in his room this evening he coughed up a large quantity of mucous and was able to swallow it back down with out any issues. They are concerned that they do not yet know the full extent of the stoke and any deficits that he may have. Both daughters mentions PT and rehab during the conversation. According to [place] he has Medicaid in [place] and they are working with a Education officer, museum to have it transferred to [place]. Care  management will need to follow up with this. Spoke with them about Medicaid placement in a long term care facility. Daughter Brad Robles has small children at home and is concerned she won't be able to care for him even with the assistance of hospice. [facility] will follow up with the family.     He is not currently inpatient appropriate. He appears to be comfortable and in no distress. Daughter Brad Robles stated that he was agitated last night. I advised her that hospice would follow up with any symptoms he may be experiencing.     Thank you for the opportunity to be of service to this patient and his family.

## 2015-03-23 NOTE — ED Notes (Signed)
Spoke with Neurologist. To come see patient.

## 2015-03-23 NOTE — Progress Notes (Signed)
.  Stroke Education provided to relative(s) and the following topics were discussed    1. Patients personal risk factors for stroke are smoking    2. Warning signs of Stroke:        * Sudden numbness or weakness of the face, arm or leg, especially on one side of          The body            * Sudden confusion, trouble speaking or understanding        * Sudden trouble seeing in one or both eyes        * Sudden trouble walking, dizziness, loss of balance or coordination        * Sudden severe headache with no known cause      3. Importance of activation Emergency Medical Services ( 9-1-1 ) immediately if experience any warning signs of stroke.    4. Be sure and schedule a follow-up appointment with your primary care doctor or any specialists as instructed.     5. You must take medicine every day to treat your risk factors for stroke.  Be sure to take your medicines exactly as your doctor tells you: no more, no less.  Know what your medicines are for , what they do.  Anti-thrombotics /anticoagulants can help prevent strokes.  You are taking the following medicine(s)  aspirin     6.  Smoking and second-hand smoke greatly increase your risk of stroke, cardiovascular disease and death. Smoking history never, smokes marijuana  7. Information provided was [facility] Stroke Education Binder, Stroke Handouts or Verbal Education    8. Documentation of teaching completed in Patient Education Activity and on Care Plan with teaching response noted?  yes

## 2015-03-23 NOTE — H&P (Signed)
NEUROLOGY HISTORY AND PHYSICAL    Name Brad Robles Age 81 y.o.   MRN [id number] DOB 05/01/1934     Consulting Physician: Malon Kindle, MD      Chief complaint: acute right hemipareisis.   HPI: This is a 81 year old right handed male with a medical history of copd, and stage 4 lung cancer. Fell yesterday and developed acute right hemiparesis. Was initially verbal but became nonverabal after arrival to the ED. He arrived one hour after onset of symptoms but did not qualify for TPA.   Codes status: DNR    Assessment and Plan    1. Right PCA territory stroke  Stroke stratification  MRI brain with contrast  Carotid  Lipid panel  TSH  homcysteine  Start plavix after cleared from speech for now rectal asa  Start atrovastatin after cleared from speech    2. Stage four lung cancer  Daughter wants comfort care measures   Continue morphine  Ativan '1mg'$  IV q8 prn anxiety    3.Left hemiparesis  There may be extension of the original stroke.   Physical therapy and speech when appropriate.    4.COPD    5. Pleural effusion    Past Medical History   Diagnosis Date   ??? COPD (chronic obstructive pulmonary disease) ([place])    ??? Seizures ([place])    ??? Stage 4 lung cancer [facility])       Past Surgical History   Procedure Laterality Date   ??? Hx colonoscopy  2011   ??? Hx orthopaedic       neck surgery for pinched nerves      Prior to Admission medications    Medication Sig Start Date End Date Taking? Authorizing Provider   tiotropium (SPIRIVA WITH HANDIHALER) 18 mcg inhalation capsule Take 1 Cap by inhalation daily.   Yes Phys Other, MD   oxyCODONE-acetaminophen (PERCOCET) 5-325 mg per tablet Take 1 Tab by mouth every four (4) hours as needed. Max Daily Amount: 6 Tabs. 02/21/15   Alain Marion, MD   promethazine (PHENERGAN) 25 mg tablet Take 1 Tab by mouth every six (6) hours as needed for Nausea. 02/21/15   Alain Marion, MD   cyclobenzaprine (FLEXERIL) 10 mg tablet Take 10 mg by mouth three (3)  times daily as needed for Muscle Spasm(s).    Phys Other, MD     Allergies   Allergen Reactions   ??? Barium Sulfate Other (comments)     Headaches     ??? Demerol [Meperidine] Other (comments)     "jerks" Slip into "black hole"     ??? Novocain [Procaine] Nausea and Vomiting   ??? Sulfa (Sulfonamide Antibiotics) Unknown (comments)     Reports as child he was told and can't remember        Social History   Substance Use Topics   ??? Smoking status: Never Smoker   ??? Smokeless tobacco: Not on file   ??? Alcohol use Yes      Comment: occasionally      No family history of parkinsons or huntingtons disease    Review of Systems   Unable to perform ROS: Patient nonverbal        Visit Vitals   ??? BP (!) 131/94   ??? Pulse 90   ??? Resp 14   ??? Ht '5\' 10"'$  (1.778 m)   ??? Wt 147 lb 0.8 oz (66.7 kg)   ??? SpO2 94%   ??? BMI 21.1 kg/m2  Physical Exam   Vitals reviewed.  Constitutional: Vital signs are normal. He appears well-developed. He is sedated.   HENT:   Head: Normocephalic and atraumatic.   Eyes: Pupils are equal, round, and reactive to light.   Cardiovascular: Normal rate and regular rhythm.    Pulmonary/Chest: Effort normal and breath sounds normal.   Abdominal: Soft. Bowel sounds are normal.   Neurological: He is unresponsive.      Neurological Exam:  Mental Status:   sedated (receive morphine 15 min prior to exam) , and oriented     Speech:     Cranial Nerves:   Intact visual fields.  Facial sensation is normal. Facial movement is symmetric.  Palate is midline.  Normal sternocleidomastoid strength. Tongue is midline. Hearing is intact bilaterally.   Eyes: PERRL, EOM's full, no nystagmus, no ptosis.   Motor:  right hemiparesis   Reflexes:   1+ babinski equivocal   Sensory:   does not withdraw either side   Gait :  deferred.    Tremor:   No and tremor.   Cerebellar:  Finger to nose was demonstrated competently.   Neurovascular: no carotid bruits. No JVD       Imaging    MRI Results (most recent):   No results found for this or any previous visit.    CT Results (most recent):    Results from Hospital Encounter encounter on 03/30/2015   CTA CHEST W WO CONT   Narrative INDICATION:   Pt with stage 4 lung cancer, now with hypoxia    COMPARISON: 02/19/2015    TECHNIQUE:   Precontrast scout images were obtained to localize the volume for acquisition.  Multislice helical CT arteriography was performed from the diaphragm to the  thoracic inlet during uneventful rapid bolus intravenous administration of 80 mL  of Isovue-370. Lung and soft tissue windows were generated. Post processing was  performed and coronal reformatted images were also generated. 3 D imaging was  performed.  CT dose reduction was achieved through use of a standardized  protocol tailored for this examination and automatic exposure control for dose  modulation.     FINDINGS:  Thyroid gland is unremarkable.    No pulmonary emboli identified.    There is a large left effusion which is progressed considerably since the prior  study with left lower lobe atelectasis and partial right upper lobe atelectasis.  Mucus is identified within the trachea. There is slight atelectasis right base.    Left hilar and mediastinal adenopathy is unchanged.         Impression IMPRESSION:   1. No pulmonary emboli identified.  2. There is a large left effusion with atelectasis of the left lower lobe and  partial atelectasis left upper lobe. Left hilar and mediastinal adenopathy is  unchanged. There is slight atelectasis right base. There is mucus within the  trachea..          Lab Review  Lab Results   Component Value Date/Time    WBC 10.2 03/25/2015 07:14 PM    HCT 40.5 04/08/2015 07:14 PM    HGB 13.9 03/15/2015 07:14 PM    PLATELET 126 04/02/2015 07:14 PM     Lab Results   Component Value Date/Time    Sodium 140 04/06/2015 07:14 PM    Potassium 3.6 03/14/2015 07:14 PM    Chloride 103 03/28/2015 07:14 PM    CO2 28 03/27/2015 07:14 PM    Glucose 143 03/26/2015 07:14 PM     BUN 14  04/04/2015 07:14 PM    Creatinine 0.62 04/05/2015 07:14 PM    Calcium 8.6 03/29/2015 07:14 PM     No results found for: B12LT, FOL, RBCF  Lab Results   Component Value Date/Time    LDL, calculated 94.6 02/20/2015 03:07 AM     Lab Results   Component Value Date/Time    Hemoglobin A1c 5.1 02/20/2015 03:07 AM     No components found for: TROPQUANT  No results found for: ANA    Signed:  Elayne Snare, MD 03/23/2015

## 2015-03-23 NOTE — Progress Notes (Signed)
Dr. Caren Macadam at bedside requesting we call on-call hospitalist to get orders for jet-nebs and possible antibiotics. Pt upper breath sounds very coarse and gurgly. Dr. Floy Sabina notified and orders received.

## 2015-03-23 NOTE — Progress Notes (Addendum)
2344 Went in to assess patient and patients respiratory status had declined since initial assessment.  His respirations were 40, his heart rate 113. His breathing was more labored than baseline with extensive retractions and accessory muscle use. Upper airway sounds coarse with crackles auscultated in all right lobes. Pt repositioned in bed w/ HOB elevated, oral suctioning also performed as patient was unable to clear secretions. Call placed to Dr. Kandace Parkins.  0005 Dr. Kandace Parkins returned page, notified of assessment findings. Orders received for scopolomine patch, one time dose '2mg'$  morphine now, '40mg'$  IV lasix now, stat portable chest xray. Xray called and notified of new order and all meds given.  0030 Respiratory at bedside to assist with suctioning. Threasa Beards, RRT nurse called to come and assess patient's condition.  Xray results were read to on-call hospitalist, Dr. Otis Peak. No new orders received.  64 Dr. Otis Peak notified at this time that patient does not have a pink DNR sheet in his chart and that his order in the computer had passed the 24 hour mark and thus he would be defaulted to a full code unless signed. She advised to receive a verbal order from the attending the morning.   0102 Pt continued to decline, pt at this time still defaulted to a full code as no DNR sheet signed.  Staff nurse, Colletta Hilltop, spoke with Dr. Otis Peak to request DNR form completion but it was deferred secondary to not knowing the policy. Nursing supervisors, Izora Gala and Mitzi Hansen, made aware of situation and hospitalist deferment to sign the DNR sheet and refusal to call family to notify of decline and test results.   64 Pt becoming agitated, refusing to keep oxygen on. Pt's respiratory status still continuing to decline. DNR sheet still not signed. Respiratory at bedside suctioning. RRT called. Daughter, Lovey Newcomer, called and notified of patient's decline. DNR confirmed over the phone by Dr. Otis Peak and pink sheet signed.    1443 Pt restless in bed and incontinent of urine. Pt cleaned and bathed, linens changed, new gown applied. '1mg'$  of ativan given.  0230 Pt resting quietly in bed. Breathing is less labored.   80 Family at bedside.  0330 Pt granddaughter comes to nursing station stating "I think he's passed". Pt unresponsive, pupils fixed, not breathing and without heart sounds. Nursing supervisor, hospitalist, and chaplain all notified.   [place] called and notified.

## 2015-03-23 NOTE — ED Notes (Signed)
Report received from [place], [place]. SBAR, Kardex, ED Summary and MAR was discussed.    Beverely Risen, RN

## 2015-03-23 NOTE — ED Notes (Signed)
Family member requesting pain medication for patient at this time. Daughter states "he seems more restless and I feel he is hurting." Pt nods yes when asked if he is hurting at this time. Medications to follow

## 2015-03-23 NOTE — Progress Notes (Signed)
Care Management Note    Received call from Lauren/ED RN.  Neurologist has ordered hospice, would like patient evaluated to see if he meets inpatient hospice.  Referral sent via CC to [facility].      Daughter at bedside with patient.      Care Management will continue to follow as needs with transition to hospice.    Francoise Schaumann, RN, BSN, [facility]  ED Care Management  [phone]

## 2015-03-23 NOTE — Consults (Signed)
??Hematology / Oncology  Consultation      [facility]    Referring: hosptitalist svc    Reason for consultation:  New diagnosis of lung cancer    Consulting: Dot Lanes, MD    ASSESSMENT:  1. Chief complaint: L lung cancer  2. L pleural and mediastinal node involvement by CT/PET  3. Bone metastasis/uptake only on PET  4. Cxr suggestive of pleural effusion evolving.  5. New CVA with aphasia and L hemiparesis  6. Grossly rhoncherous, seems to have problems handling secretions  7. Severe new disability due to CVA  8. Weight loss      RECOMMENDATIONS:  At this point his PS is 3, it is unclear if he can eat and handle secretions   Repeat CxR   Pulmonary toilet, breathing RX, d/w RN will have hospitalist adjust  Care  Pullmonary consult in am, speech and swallow eval.  Best supportive care for now, CVA RX, hospice consideration if not improving.  Daughter decided DNR and is aware that antineoplastic  Treatment for cancer  in his current condition is not possible.    HPI:  81 y/o WM with recent diagnosis of COPD and lung cancer by Dr. Karie Kirks admitted with CVA presenting with aphasia and L hemiparesis.     The patient is unable to speak and history was obtained from his daughter Lovey Newcomer at the bedside. The pt was d/w dayshift and night shift nurse. The patient was set up to see Dr. Alveta Heimlich on Tuesday for his newly diagnosed lung cancer.    He was drinking Ensure, not eating much loosing weight during the work up for his cancer. PET showed question of bone metastases and pleural disease, L mass and L mediast LN enlargement. He was not very active the last few weeks before the aphasia and L sided weakness started. No h/o urinary, GI symptoms, headaches, visual changes before the CVA event.     The rhonchi and difficulty coughing up is new today,     ROS with daughter and nurses          The remainder of a 12 point review of system was negative.    I discussed the patient with the RN    The hospital record was reviewed.    Imaging studies reviewed:    Recent Labs      03/20/2015   1914   WBC  10.2   GRANS  65   HGB  13.9   PLT  126*   INR  1.2*   APTT  25.3   NA  140   K  3.6   GLU  143*   BUN  14   CREA  0.62*   ALT  17   SGOT  23   TBILI  0.5   AP  109          Current Medications:  Current Facility-Administered Medications   Medication Dose Route Frequency   ??? umeclidinium (INCRUSE ELLIPTA) 62.5 mcg/actuation  1 Puff Inhalation DAILY   ??? sodium chloride (NS) flush 5-10 mL  5-10 mL IntraVENous Q8H   ??? sodium chloride (NS) flush 5-10 mL  5-10 mL IntraVENous PRN   ??? heparin (porcine) injection 5,000 Units  5,000 Units SubCUTAneous Q12H   ??? 0.9% sodium chloride infusion  100 mL/hr IntraVENous CONTINUOUS   ??? morphine injection 1 mg  1 mg IntraVENous Q4H PRN   ??? LORazepam (ATIVAN) injection 1 mg  1 mg IntraVENous Q6H PRN   ??? [  START ON 04/12/2015] aspirin (ASA) suppository 300 mg  300 mg Rectal DAILY   ??? albuterol-ipratropium (DUO-NEB) 2.5 MG-0.5 MG/3 ML  3 mL Nebulization Q4H RT       Allergies   Allergen Reactions   ??? Barium Sulfate Other (comments)     Headaches     ??? Demerol [Meperidine] Other (comments)     "jerks" Slip into "black hole"     ??? Novocain [Procaine] Nausea and Vomiting   ??? Sulfa (Sulfonamide Antibiotics) Unknown (comments)     Reports as child he was told and can't remember         PMH:  Past Medical History   Diagnosis Date   ??? COPD (chronic obstructive pulmonary disease) ([place])    ??? Seizures ([place])    ??? Stage 4 lung cancer ([place])        Family History:  History reviewed. No pertinent family history.    Social History:  Social History     Social History   ??? Marital status: DIVORCED     Spouse name: N/A   ??? Number of children: N/A   ??? Years of education: N/A     Occupational History   ??? Not on file.     Social History Main Topics   ??? Smoking status: Never Smoker   ??? Smokeless tobacco: Not on file   ??? Alcohol use Yes      Comment: occasionally   ??? Drug use: No    ??? Sexual activity: Not on file     Other Topics Concern   ??? Not on file     Social History Narrative       Medications prior to admission:  No current facility-administered medications on file prior to encounter.      Current Outpatient Prescriptions on File Prior to Encounter   Medication Sig Dispense Refill   ??? oxyCODONE-acetaminophen (PERCOCET) 5-325 mg per tablet Take 1 Tab by mouth every four (4) hours as needed. Max Daily Amount: 6 Tabs. 20 Tab 0   ??? promethazine (PHENERGAN) 25 mg tablet Take 1 Tab by mouth every six (6) hours as needed for Nausea. 30 Tab 0   ??? cyclobenzaprine (FLEXERIL) 10 mg tablet Take 10 mg by mouth three (3) times daily as needed for Muscle Spasm(s).           Exam:     General appearance  Alert,  midl dyspnea at rest,  Chronically ill appearing   Eyes  Conjunctivae clear. Lids normal. Anicteric   ENT No thrush; no mucositis   Lungs  Gross rhonchi over central airways, decreased BS on L   Chest wall  No deformities   Heart  Regular rate and rhythm, edema absent   Abdomen  Soft and non-tender; no masses, bowel sounds present, no distension   Extremities Symmetric, no edema   Skin No rash, no bruises   Neurologic Alert, aphasia, moves R extremities only,    Psychatric: Cooperative, unable to assess   Musculoskeletal No joint deformities, no musclewasting.   Line sites No bleeding, no tenderness.             Treating physicians: Treatment Team: Attending Provider: Malon Kindle, MD; Consulting Provider: Elayne Snare, MD; Consulting Provider: Sherrell Puller, MD    Total time >50 % spent on floor with counseling and coordination of care:

## 2015-03-23 NOTE — ED Notes (Signed)
Spoke with supervisor at this time about the bed assignment, she sts it is being cleaned now and will be about 45 min.

## 2015-03-24 ENCOUNTER — Inpatient Hospital Stay: Admit: 2015-03-24 | Payer: MEDICARE | Primary: Family Medicine

## 2015-03-24 MED ORDER — FUROSEMIDE 10 MG/ML IJ SOLN
10 mg/mL | Freq: Once | INTRAMUSCULAR | Status: AC
Start: 2015-03-24 — End: 2015-03-24
  Administered 2015-03-24: 05:00:00 via INTRAVENOUS

## 2015-03-24 MED ORDER — IPRATROPIUM-ALBUTEROL 2.5 MG-0.5 MG/3 ML NEB SOLUTION
2.5 mg-0.5 mg/3 ml | RESPIRATORY_TRACT | Status: DC
Start: 2015-03-24 — End: 2015-03-24
  Administered 2015-03-24 (×3): via RESPIRATORY_TRACT

## 2015-03-24 MED ORDER — SCOPOLAMINE (1.3-1.5) MG 72 HR TRANSDERM PATCH
1 mg over 3 days | TRANSDERMAL | Status: DC
Start: 2015-03-24 — End: 2015-03-24

## 2015-03-24 MED ORDER — MORPHINE 2 MG/ML INJECTION
2 mg/mL | Freq: Once | INTRAMUSCULAR | Status: AC
Start: 2015-03-24 — End: 2015-03-24
  Administered 2015-03-24: 05:00:00 via INTRAVENOUS

## 2015-03-24 MED FILL — IPRATROPIUM-ALBUTEROL 2.5 MG-0.5 MG/3 ML NEB SOLUTION: 2.5 mg-0.5 mg/3 ml | RESPIRATORY_TRACT | Qty: 3

## 2015-03-24 MED FILL — TRANSDERM-SCOP 1 MG OVER 3 DAYS TRANSDERMAL PATCH: 1 mg over 3 days | TRANSDERMAL | Qty: 1

## 2015-03-24 MED FILL — MORPHINE 2 MG/ML INJECTION: 2 mg/mL | INTRAMUSCULAR | Qty: 1

## 2015-03-24 MED FILL — BD POSIFLUSH NORMAL SALINE 0.9 % INJECTION SYRINGE: INTRAMUSCULAR | Qty: 10

## 2015-03-24 MED FILL — FUROSEMIDE 10 MG/ML IJ SOLN: 10 mg/mL | INTRAMUSCULAR | Qty: 4

## 2015-03-24 MED FILL — LORAZEPAM 2 MG/ML IJ SOLN: 2 mg/mL | INTRAMUSCULAR | Qty: 1

## 2015-03-24 NOTE — Other (Signed)
Unable to complete with NCTracks due to Locator Code error

## 2015-03-24 NOTE — Progress Notes (Signed)
Called nursing supervisor to inform that patient has order in EMR for DNR, however no pink form in chart. Lawyer, stated pt is to be considered a full code until a pink sheet is signed in chart. Supervisor instructed RN staff to call the in house physician to get them to come up and sign a pink sheet for DNR. This RN called and spoke with MD Yaqub. Yaqub questioned whether the DNR order was per patient request or family request. Informed MD that patient is nonverbal and declining with worsening respiratory status. MD stated she was unaware of the policy on how to complete pink sheet as she did not admit the patient. MD unsure if she can get a verbal order over the phone from the family for DNR. Informed MD that she could complete DNR form after verifying wishes with patients POA. Asked MD to call family and inform them of decline and new test results indicating worsening respiratory status. MD then asked why any tests were done as patient is "on comfort measure", as written in MD Le's note from 02/12. Informed MD that patient is not considered comfort care at this time. Hospice was consulted and met with family, however family was unable to make a decision at that time. MD then stated that she was admitting patients in ED and could not call and a verbal order could be completed with AM rounding physician. Informed MD that patient may not make it till the morning due to rapid decline in respiratory status. MD then stated family would have to come in to sign the pink sheet and then she would sign.   Nursing supervisor informed of situation, who then stated she would contact Marcie Bal with risk management to discuss situation.  RRT then called as pink sheet and DNR status still yet to be resolved and patient continuing to decline.

## 2015-03-24 NOTE — Progress Notes (Signed)
Responded to page notification of patient passing. Pt's daughter Lovey Newcomer and granddaughter were present.  Provided emotional and spiritual support to family. Mr. Yoak lived with his daughter. He has another daughter who is on her way from [place], [place]. Offered supportive listening as daughter described her father's life and the love they shared.  Mr. Wecker had a strong faith and Lovey Newcomer finds comfort in that.      Family is undecided on funeral home/cremation.  I shared information with family at family request about [employer] [place]; assisted family in securing phone number etc.  Family will call back to hospital after they have made arrangements - Lovey Newcomer would like for her sister to make those phone calls.      Assured family of prayers and care.  Family is going to remain until her sister arrives. No further pastoral care needs at this time.  [phone].  Visit by: Rev. Saidi Santacroce L. Dockum, D.Min, MA, BCC    [employer], operational

## 2015-03-24 NOTE — Progress Notes (Addendum)
RRT 0143 am  called for worsening respiratory status, pt is DNR in EMR but DNR pink sheet has not been done past 24 hours and that will default him to full code.  CXR : shows complete left lung opacification  Exam: Pt altered and not following any commands  A/P:  Stage 4 Lung CA with left lung malignant effusion-as per primary attending decision is made to treat medically and conservatively only  Called daughter sandy williams and updated regarding change in patient's medical condition, voiced understanding  Confirmed the DNR status with her on phone and DNR completed.    Time spent 30 min

## 2015-03-24 NOTE — Progress Notes (Signed)
Patient unresponsive.  Pupils fixed.  No spontaneous respirations.  No heart sounds auscultated.    Death pronounced at [phone]

## 2015-03-24 NOTE — Other (Deleted)
Dr. Marin Comment, Marylouise Stacks :  Please clarify if this patient is being treated/managed for:    =>Malnutrition: mild/moderate in setting of cachexia, aphasia, stage 4 lung cancer, bone cancer, noted not eating alot during his cancer work up, loosing weight, bmi 21. albumin 27   =>Other Explanation of clinical findings  =>Unable to Determine (no explanation of clinical findings)    The medical record reflects the following clinical findings, treatment, and risk factors:    Risk Factors: age, cachexia noted  Clinical Indicators: not eating much, loosing wt during the work up for his cancer noted by hematology, albumin 2.7, t protein 6.1, skin frail with loss of sq tissue, aphasia,   Treatment: Ensure, advance diet as tolerated    Please clarify and document your clinical opinion in the progress notes and discharge summary including the definitive and/or presumptive diagnosis, (suspected or probable), related to the above clinical findings. Please include clinical findings supporting your diagnosis.    Thank You, Thedore Mins. Chrissie Noa RN [place]; [phone]

## 2015-03-24 NOTE — Other (Deleted)
Dr. Marin Comment, Marylouise Stacks :  Please clarify if this patient is being treated/managed for:    =>Cachexia in setting of aphasia, stage 4 lung cancer, bone cancer, noted not eating alot during his cancer work up, loosing weight, bmi 21. albumin 27   =>Other Explanation of clinical findings  =>Unable to Determine (no explanation of clinical findings)    The medical record reflects the following clinical findings, treatment, and risk factors:    Risk Factors: age, cachexia noted  Clinical Indicators: not eating much, loosing wt during the work up for his cancer noted by hematology, albumin 2.7, t protein 6.1, skin frail with loss of sq tissue, aphasia,   Treatment: Ensure, advance diet as tolerated    Please clarify and document your clinical opinion in the progress notes and discharge summary including the definitive and/or presumptive diagnosis, (suspected or probable), related to the above clinical findings. Please include clinical findings supporting your diagnosis.      Thank You, Thedore Mins. Chrissie Noa RN [place]; [phone]

## 2015-03-24 NOTE — Other (Signed)
Report given to Renford Dills, RN

## 2015-03-24 NOTE — Discharge Summary (Addendum)
Death Summary      Name: [name]  [id number]  Date of Birth: March 23, 1934 (Age: 81 y.o.)   Date of Admission: 04/06/2015  Date of Discharge: 04/13/15  Attending Physician: No att. providers found    Discharge Diagnosis:   CVA  Lung CA, stage 4  COPD  Acute hypoxic respiratory failure    Consultants: Neurology/oncology    Procedures: none    Brief Admission History/Reason for Admission Per Eldridge Dace, MD:   Records obtained from er   Brad Robles is a 81 y.o. 2mle with PMhx significant for COPD, Stage 4 Lung CA, Bone CA, lymph node CA who presents via EMS to the ED with cc of sudden onset of right extremity weakness. Per daughter states that pt went to use the bathroom and was found on the floor. Patients last known well time ~ 1800. Daughter notes that pts right extremities were not being used due to weakness. Daughter also c/o right sided facial droop. EMS reports pt blood glucose levels were 130 en route. EMS states pt opens eyes on commands but notes pt is not verbal or following other commands. Per pts family pt takes only an inhaler at home.of note pt has not seen by onco to date.Daughter only wants comfort measures at this time. Pt has active DNR.   ??  We were asked to admit for work up and evaluation of the above problems.   Brief Hospital Course by Main Problems:   Right PCA territory CVA  L sided hemiparesis/aphasia  -CT head showed focal low density in the R occipital lobe, which may represent acute or subacute ischemia, without hemorrhage or mass effect.      Acute hypoxic respiratory failure  Lung CA, stage IV/pleural effusion  COPD  CTA neg for PE.  Per daughter, pt is on comfort measure. Oncology followed during hospital stay.  Repeat CXR showed complete opacification of L hemithorax likely from aspiration.  Patient was unable to control secretions despite aspiration precaution.    Malnutrition/Cachexia in the setting of stage 4 lung cancer. Albumin 2.7, BMI 21.     Discharge Exam:  Patient seen and examined by me on discharge day.  Pertinent Findings: per Dr YKemper Durienote  Patient unresponsive. Pupils fixed. No spontaneous respirations. No heart sounds auscultated.    Discharge/Recent Laboratory Results:  Recent Labs      03/31/2015   1914   NA  140   K  3.6   CL  103   CO2  28   BUN  14   GLU  143*   CA  8.6   MG  2.2     Recent Labs      03/20/2015   1914   HGB  13.9   HCT  40.5   WBC  10.2   PLT  126*         Total time in minutes spent coordinating this discharge (includes going over instructions, follow-up, prescriptions, and preparing report for sign off to her PCP) :  35 minutes

## 2015-04-09 DEATH — deceased
